# Patient Record
Sex: Female | Born: 1945 | Race: White | Hispanic: No | Marital: Married | Smoking: Never smoker
Health system: Southern US, Community
[De-identification: ages and names within clinical notes are randomized; demographics above are authoritative.]

## PROBLEM LIST (undated history)

## (undated) DIAGNOSIS — I10 Essential (primary) hypertension: Secondary | ICD-10-CM

## (undated) DIAGNOSIS — G501 Atypical facial pain: Principal | ICD-10-CM

## (undated) HISTORY — PX: TONSILLECTOMY: SUR1361

## (undated) HISTORY — DX: Essential (primary) hypertension: I10

## (undated) HISTORY — DX: Atypical facial pain: G50.1

## (undated) HISTORY — PX: SHOULDER SURGERY: SHX246

---

## 1998-02-04 ENCOUNTER — Other Ambulatory Visit: Admission: RE | Admit: 1998-02-04 | Discharge: 1998-02-04 | Payer: Self-pay | Admitting: Obstetrics and Gynecology

## 1998-04-06 ENCOUNTER — Other Ambulatory Visit: Admission: RE | Admit: 1998-04-06 | Discharge: 1998-04-06 | Payer: Self-pay | Admitting: Obstetrics and Gynecology

## 1999-02-08 ENCOUNTER — Other Ambulatory Visit: Admission: RE | Admit: 1999-02-08 | Discharge: 1999-02-08 | Payer: Self-pay | Admitting: Obstetrics and Gynecology

## 1999-02-28 ENCOUNTER — Other Ambulatory Visit: Admission: RE | Admit: 1999-02-28 | Discharge: 1999-02-28 | Payer: Self-pay | Admitting: Obstetrics and Gynecology

## 2000-01-17 ENCOUNTER — Encounter: Admission: RE | Admit: 2000-01-17 | Discharge: 2000-01-17 | Payer: Self-pay | Admitting: Obstetrics and Gynecology

## 2000-01-17 ENCOUNTER — Encounter: Payer: Self-pay | Admitting: Obstetrics and Gynecology

## 2000-02-09 ENCOUNTER — Other Ambulatory Visit: Admission: RE | Admit: 2000-02-09 | Discharge: 2000-02-09 | Payer: Self-pay | Admitting: Obstetrics and Gynecology

## 2001-02-19 ENCOUNTER — Other Ambulatory Visit: Admission: RE | Admit: 2001-02-19 | Discharge: 2001-02-19 | Payer: Self-pay | Admitting: Obstetrics and Gynecology

## 2001-05-23 ENCOUNTER — Encounter: Admission: RE | Admit: 2001-05-23 | Discharge: 2001-05-23 | Payer: Self-pay | Admitting: Obstetrics and Gynecology

## 2001-05-23 ENCOUNTER — Encounter: Payer: Self-pay | Admitting: Obstetrics and Gynecology

## 2002-03-06 ENCOUNTER — Other Ambulatory Visit: Admission: RE | Admit: 2002-03-06 | Discharge: 2002-03-06 | Payer: Self-pay | Admitting: Obstetrics and Gynecology

## 2002-11-12 ENCOUNTER — Encounter: Payer: Self-pay | Admitting: Obstetrics and Gynecology

## 2002-11-12 ENCOUNTER — Encounter: Admission: RE | Admit: 2002-11-12 | Discharge: 2002-11-12 | Payer: Self-pay | Admitting: Obstetrics and Gynecology

## 2004-04-07 ENCOUNTER — Ambulatory Visit (HOSPITAL_COMMUNITY): Admission: RE | Admit: 2004-04-07 | Discharge: 2004-04-07 | Payer: Self-pay | Admitting: Obstetrics and Gynecology

## 2006-05-18 ENCOUNTER — Encounter: Admission: RE | Admit: 2006-05-18 | Discharge: 2006-05-18 | Payer: Self-pay | Admitting: Family Medicine

## 2006-06-27 ENCOUNTER — Ambulatory Visit (HOSPITAL_BASED_OUTPATIENT_CLINIC_OR_DEPARTMENT_OTHER): Admission: RE | Admit: 2006-06-27 | Discharge: 2006-06-28 | Payer: Self-pay | Admitting: Orthopedic Surgery

## 2006-10-15 ENCOUNTER — Ambulatory Visit: Payer: Self-pay | Admitting: Gastroenterology

## 2006-11-01 ENCOUNTER — Ambulatory Visit: Payer: Self-pay | Admitting: Gastroenterology

## 2007-05-21 ENCOUNTER — Encounter: Admission: RE | Admit: 2007-05-21 | Discharge: 2007-05-21 | Payer: Self-pay | Admitting: Family Medicine

## 2009-01-19 ENCOUNTER — Encounter: Admission: RE | Admit: 2009-01-19 | Discharge: 2009-01-19 | Payer: Self-pay | Admitting: Obstetrics and Gynecology

## 2010-06-07 ENCOUNTER — Encounter: Admission: RE | Admit: 2010-06-07 | Discharge: 2010-06-07 | Payer: Self-pay | Admitting: Family Medicine

## 2010-06-14 ENCOUNTER — Encounter: Admission: RE | Admit: 2010-06-14 | Discharge: 2010-06-14 | Payer: Self-pay | Admitting: Family Medicine

## 2011-03-24 NOTE — Op Note (Signed)
NAME:  Dawn Rowe, Dawn Rowe             ACCOUNT NO.:  [id number]   MEDICAL RECORD NO.:  [id number]          PATIENT TYPE:  AMB   LOCATION:  DSC                          FACILITY:  [facility]   PHYSICIAN:  Harvie Junior, M.D.   DATE OF BIRTH:  16-Sep-1946   DATE OF PROCEDURE:  06/27/2006  DATE OF DISCHARGE:  06/28/2006                                 OPERATIVE REPORT   PREOPERATIVE DIAGNOSES:  Rotator cuff tear, MRI documented, with  impingement, acromioclavicular joint arthritis.   POSTOPERATIVE DIAGNOSES:  1. Rotator cuff tear, MRI documented, with impingement.  2. Acromioclavicular joint arthritis.  3. Superior labral tear, anterior to posterior, with undersurface rotator      cuff tear.   PRINCIPLE PROCEDURES:  1. Mini-open rotator cuff repair of chronically torn rotator cuff, which      was severely retracted, and repaired through bone tunnels, with      corresponding acromioplasty.  2. Distal clavicle resection, arthroscopic.  3. Debridement of superior labrum and undersurface rotator cuff tear.   SURGEON:  Harvie Junior, M.D.   ASSISTANT:  Marshia Ly, P.A. (assisted throughout the case)   NOTE:  The date of surgery is going to be June 27, 2006.  Date of repeat  of lost dictation is going to be July 13, 2006.   ANESTHESIA:  General.   ESTIMATED BLOOD LOSS:  None.   BRIEF HISTORY:  Miss Hobbs is a female patient, who has a long history of  having had a right shoulder procedure performed.  She had done well with  that and began having left shoulder pain.  MRI was obtained and showed a  chronically torn and retracted rotator cuff.  We talked about treatment  options, including observation vital signs fixation, and the patient  ultimately wanted to have this fixed.  She had done great on the other side,  so we brought her to the operating room for attempted fixation; but we were  certainly concerned about the retraction preoperatively.   DESCRIPTION OF PROCEDURE:   The patient was brought to the operating room,  and an adequate level of anesthesia was obtained with a general anesthetic,  the patient was placed supine on the operating table, and was then moved to  the beach-chair position and all bony prominences were well padded.  Attention was then turned to the left shoulder.  After this, routine  arthroscopic examination of the shoulder revealed that there was no  significant glenohumeral abnormality.  There was a chronically torn and  retracted rotator cuff that was retracted just shy of the glenoid margin.  We mobilized the cuff anteriorly and posteriorly and could mobilize it to  where it would move almost to the humeral margin, although it was under some  tension.  The biceps tendon was somewhat flattened, but was intact.  The  superior labrum was torn, and this was debrided within the glenohumeral  joint.  The rotator cuff edges were debrided within the glenohumeral joint,  and attention at that time was turned into the subacromial space, where an  anterolateral acromioplasty was performed from the lateral and  posterior  compartment.  The distal clavicle was resected through an anterior  compartment over a span of probably 17 mm.  The rotator cuff was then  identified from the lateral portal.  It was grasped and tugged.  It was  freed up, and we felt that we were going to have to medialize the insertion  point, but given that she had done so well on the opposite side, we felt  that attempt at repair was appropriate, and at this point, a small lateral  incision was made and the arthroscopic portion of the case was abandoned.  Once this was accomplished, the deltoid was split and the rotator cuff was  identified.  Traction stitches were placed, and we began to mobilize the  cuff anteriorly to posteriorly.  Once we mobilized it, we were able to get  it essentially to the humeral margin.  We made a bony trough in the humeral  head.  Once this was  accomplished, the stitches were passed with locking  stitches in the rotator cuff free edge, and the bone tunnels were placed.  Once the stitches had been placed through the bone tunnels, the anterior  stitch went well.  The second and third stitches, unfortunately, coalesced  in the bone tunnel and retracted back.  At that point, it was felt that  additional fixation would be necessary.  The posterior portion had been  pulled down into the bone trough as well, and there were concerns about  coalescence in this area as well.  Once this had been completed, a 5.5  suture anchor was placed into cortical bone down distally, and the stays off  of this were tied to each of the 2 stitches, which had coalesced in the bone  trough, and actually gave excellent fixation, as well as keeping the tendon  stuck down into the bone trough.  The bone trough had been created  essentially on the humeral head articular surface to make sure that we had  medialized the insertion point.  Otherwise, the rotator cuff was not going  to be able to be repaired under no tension.  This was repaired with the arm  at the side.  At this point, the wound was copiously irrigated.  The distal  clavicle resection and acromioplasty have previously been performed, and  these were evaluated open and felt to be adequate.  The deltoid was then  closed with 1 Vicryl running suture, the subcu with 0 and 2-0 Vicryl, and  the skin with a 3-0 Maxon pullout suture.  Benzoin and Steri-Strips were  applied.  The patient was placed into an UltraSling.  This did take some  tension off the rotator cuff repair, but clearly, the arm could come to the  side without excessive tension on the rotator cuff.  At this point, the  patient was taken to the recovery room, where she was noted to be in  satisfactory condition.      Harvie Junior, M.D.  Electronically Signed    JLG/MEDQ  D:  07/13/2006  T:  07/13/2006  Job:  [id number]

## 2011-08-07 ENCOUNTER — Other Ambulatory Visit: Payer: Self-pay | Admitting: Family Medicine

## 2011-08-07 DIAGNOSIS — Z1231 Encounter for screening mammogram for malignant neoplasm of breast: Secondary | ICD-10-CM

## 2011-09-01 ENCOUNTER — Ambulatory Visit
Admission: RE | Admit: 2011-09-01 | Discharge: 2011-09-01 | Disposition: A | Discharge status: Home / Self Care | Payer: BLUE CROSS/BLUE SHIELD | Source: Ambulatory Visit | Attending: Family Medicine | Admitting: Family Medicine

## 2011-09-01 DIAGNOSIS — Z1231 Encounter for screening mammogram for malignant neoplasm of breast: Secondary | ICD-10-CM

## 2011-10-20 ENCOUNTER — Other Ambulatory Visit (HOSPITAL_COMMUNITY): Payer: Self-pay | Admitting: Obstetrics and Gynecology

## 2011-10-20 DIAGNOSIS — N950 Postmenopausal bleeding: Secondary | ICD-10-CM

## 2011-10-25 ENCOUNTER — Ambulatory Visit (HOSPITAL_COMMUNITY)
Admission: RE | Admit: 2011-10-25 | Discharge: 2011-10-25 | Disposition: A | Discharge status: Home / Self Care | Payer: Medicare Other | Source: Ambulatory Visit | Attending: Obstetrics and Gynecology | Admitting: Obstetrics and Gynecology

## 2011-10-25 ENCOUNTER — Other Ambulatory Visit: Payer: Self-pay | Admitting: Obstetrics and Gynecology

## 2011-10-25 DIAGNOSIS — D251 Intramural leiomyoma of uterus: Secondary | ICD-10-CM | POA: Insufficient documentation

## 2011-10-25 DIAGNOSIS — N950 Postmenopausal bleeding: Secondary | ICD-10-CM | POA: Insufficient documentation

## 2011-10-25 DIAGNOSIS — D250 Submucous leiomyoma of uterus: Secondary | ICD-10-CM | POA: Insufficient documentation

## 2012-09-17 ENCOUNTER — Other Ambulatory Visit: Payer: Self-pay | Admitting: Family Medicine

## 2012-09-17 DIAGNOSIS — Z1231 Encounter for screening mammogram for malignant neoplasm of breast: Secondary | ICD-10-CM

## 2012-09-17 DIAGNOSIS — Z780 Asymptomatic menopausal state: Secondary | ICD-10-CM

## 2012-10-28 ENCOUNTER — Ambulatory Visit: Payer: BLUE CROSS/BLUE SHIELD

## 2012-10-28 ENCOUNTER — Other Ambulatory Visit: Payer: BLUE CROSS/BLUE SHIELD

## 2012-11-29 ENCOUNTER — Ambulatory Visit
Admission: RE | Admit: 2012-11-29 | Discharge: 2012-11-29 | Disposition: A | Discharge status: Home / Self Care | Payer: Medicare Other | Source: Ambulatory Visit | Attending: Family Medicine | Admitting: Family Medicine

## 2012-11-29 DIAGNOSIS — Z1231 Encounter for screening mammogram for malignant neoplasm of breast: Secondary | ICD-10-CM

## 2012-11-29 DIAGNOSIS — Z780 Asymptomatic menopausal state: Secondary | ICD-10-CM

## 2013-11-04 ENCOUNTER — Other Ambulatory Visit: Payer: Self-pay

## 2013-11-04 DIAGNOSIS — Z1231 Encounter for screening mammogram for malignant neoplasm of breast: Secondary | ICD-10-CM

## 2013-12-04 ENCOUNTER — Ambulatory Visit
Admission: RE | Admit: 2013-12-04 | Discharge: 2013-12-04 | Disposition: A | Discharge status: Home / Self Care | Payer: Self-pay | Source: Ambulatory Visit

## 2013-12-04 DIAGNOSIS — Z1231 Encounter for screening mammogram for malignant neoplasm of breast: Secondary | ICD-10-CM

## 2014-10-27 ENCOUNTER — Encounter: Payer: Self-pay | Admitting: Gastroenterology

## 2015-01-21 ENCOUNTER — Other Ambulatory Visit: Payer: Self-pay

## 2015-01-21 DIAGNOSIS — Z1231 Encounter for screening mammogram for malignant neoplasm of breast: Secondary | ICD-10-CM

## 2015-01-25 ENCOUNTER — Other Ambulatory Visit: Payer: Self-pay | Admitting: Family Medicine

## 2015-01-25 DIAGNOSIS — M8580 Other specified disorders of bone density and structure, unspecified site: Secondary | ICD-10-CM

## 2015-02-04 ENCOUNTER — Ambulatory Visit
Admission: RE | Admit: 2015-02-04 | Discharge: 2015-02-04 | Disposition: A | Discharge status: Home / Self Care | Payer: Medicare HMO | Source: Ambulatory Visit | Attending: Family Medicine | Admitting: Family Medicine

## 2015-02-04 ENCOUNTER — Ambulatory Visit
Admission: RE | Admit: 2015-02-04 | Discharge: 2015-02-04 | Disposition: A | Discharge status: Home / Self Care | Payer: Medicare HMO | Source: Ambulatory Visit

## 2015-02-04 DIAGNOSIS — M8580 Other specified disorders of bone density and structure, unspecified site: Secondary | ICD-10-CM

## 2015-02-04 DIAGNOSIS — Z1231 Encounter for screening mammogram for malignant neoplasm of breast: Secondary | ICD-10-CM

## 2015-08-16 DIAGNOSIS — D225 Melanocytic nevi of trunk: Secondary | ICD-10-CM | POA: Diagnosis not present

## 2015-08-16 DIAGNOSIS — L570 Actinic keratosis: Secondary | ICD-10-CM | POA: Diagnosis not present

## 2015-08-16 DIAGNOSIS — D2272 Melanocytic nevi of left lower limb, including hip: Secondary | ICD-10-CM | POA: Diagnosis not present

## 2015-08-16 DIAGNOSIS — L821 Other seborrheic keratosis: Secondary | ICD-10-CM | POA: Diagnosis not present

## 2015-08-16 DIAGNOSIS — I788 Other diseases of capillaries: Secondary | ICD-10-CM | POA: Diagnosis not present

## 2015-08-23 DIAGNOSIS — M5442 Lumbago with sciatica, left side: Secondary | ICD-10-CM | POA: Diagnosis not present

## 2015-08-23 DIAGNOSIS — M545 Low back pain: Secondary | ICD-10-CM | POA: Diagnosis not present

## 2015-08-30 DIAGNOSIS — M545 Low back pain: Secondary | ICD-10-CM | POA: Diagnosis not present

## 2015-09-02 DIAGNOSIS — M5442 Lumbago with sciatica, left side: Secondary | ICD-10-CM | POA: Diagnosis not present

## 2015-09-03 DIAGNOSIS — M5416 Radiculopathy, lumbar region: Secondary | ICD-10-CM | POA: Diagnosis not present

## 2015-09-11 DIAGNOSIS — Z23 Encounter for immunization: Secondary | ICD-10-CM | POA: Diagnosis not present

## 2015-09-15 DIAGNOSIS — M5416 Radiculopathy, lumbar region: Secondary | ICD-10-CM | POA: Diagnosis not present

## 2015-09-27 DIAGNOSIS — M5416 Radiculopathy, lumbar region: Secondary | ICD-10-CM | POA: Diagnosis not present

## 2015-12-21 DIAGNOSIS — H5213 Myopia, bilateral: Secondary | ICD-10-CM | POA: Diagnosis not present

## 2015-12-21 DIAGNOSIS — H25043 Posterior subcapsular polar age-related cataract, bilateral: Secondary | ICD-10-CM | POA: Diagnosis not present

## 2015-12-29 DIAGNOSIS — E559 Vitamin D deficiency, unspecified: Secondary | ICD-10-CM | POA: Diagnosis not present

## 2015-12-29 DIAGNOSIS — L989 Disorder of the skin and subcutaneous tissue, unspecified: Secondary | ICD-10-CM | POA: Diagnosis not present

## 2015-12-29 DIAGNOSIS — E785 Hyperlipidemia, unspecified: Secondary | ICD-10-CM | POA: Diagnosis not present

## 2015-12-29 DIAGNOSIS — I10 Essential (primary) hypertension: Secondary | ICD-10-CM | POA: Diagnosis not present

## 2015-12-29 DIAGNOSIS — Z79899 Other long term (current) drug therapy: Secondary | ICD-10-CM | POA: Diagnosis not present

## 2015-12-29 DIAGNOSIS — Z1389 Encounter for screening for other disorder: Secondary | ICD-10-CM | POA: Diagnosis not present

## 2016-01-06 DIAGNOSIS — R69 Illness, unspecified: Secondary | ICD-10-CM | POA: Diagnosis not present

## 2016-01-19 DIAGNOSIS — Z01419 Encounter for gynecological examination (general) (routine) without abnormal findings: Secondary | ICD-10-CM | POA: Diagnosis not present

## 2016-01-19 DIAGNOSIS — Z6825 Body mass index (BMI) 25.0-25.9, adult: Secondary | ICD-10-CM | POA: Diagnosis not present

## 2016-03-06 ENCOUNTER — Other Ambulatory Visit: Payer: Self-pay

## 2016-03-06 DIAGNOSIS — Z1231 Encounter for screening mammogram for malignant neoplasm of breast: Secondary | ICD-10-CM

## 2016-03-09 ENCOUNTER — Other Ambulatory Visit: Payer: Self-pay

## 2016-03-09 ENCOUNTER — Ambulatory Visit
Admission: RE | Admit: 2016-03-09 | Discharge: 2016-03-09 | Disposition: A | Discharge status: Home / Self Care | Payer: Medicare HMO | Source: Ambulatory Visit

## 2016-03-09 DIAGNOSIS — Z1231 Encounter for screening mammogram for malignant neoplasm of breast: Secondary | ICD-10-CM

## 2016-03-18 DIAGNOSIS — J0100 Acute maxillary sinusitis, unspecified: Secondary | ICD-10-CM | POA: Diagnosis not present

## 2016-07-11 DIAGNOSIS — R69 Illness, unspecified: Secondary | ICD-10-CM | POA: Diagnosis not present

## 2016-08-17 ENCOUNTER — Encounter: Payer: Self-pay | Admitting: Gastroenterology

## 2016-09-03 DIAGNOSIS — Z23 Encounter for immunization: Secondary | ICD-10-CM | POA: Diagnosis not present

## 2016-10-16 DIAGNOSIS — L918 Other hypertrophic disorders of the skin: Secondary | ICD-10-CM | POA: Diagnosis not present

## 2016-10-16 DIAGNOSIS — D2271 Melanocytic nevi of right lower limb, including hip: Secondary | ICD-10-CM | POA: Diagnosis not present

## 2016-10-16 DIAGNOSIS — D225 Melanocytic nevi of trunk: Secondary | ICD-10-CM | POA: Diagnosis not present

## 2016-10-16 DIAGNOSIS — L814 Other melanin hyperpigmentation: Secondary | ICD-10-CM | POA: Diagnosis not present

## 2016-10-16 DIAGNOSIS — D2262 Melanocytic nevi of left upper limb, including shoulder: Secondary | ICD-10-CM | POA: Diagnosis not present

## 2016-10-16 DIAGNOSIS — L821 Other seborrheic keratosis: Secondary | ICD-10-CM | POA: Diagnosis not present

## 2016-10-16 DIAGNOSIS — D1801 Hemangioma of skin and subcutaneous tissue: Secondary | ICD-10-CM | POA: Diagnosis not present

## 2016-10-16 DIAGNOSIS — I788 Other diseases of capillaries: Secondary | ICD-10-CM | POA: Diagnosis not present

## 2016-12-22 DIAGNOSIS — H5213 Myopia, bilateral: Secondary | ICD-10-CM | POA: Diagnosis not present

## 2016-12-22 DIAGNOSIS — H25043 Posterior subcapsular polar age-related cataract, bilateral: Secondary | ICD-10-CM | POA: Diagnosis not present

## 2017-01-02 DIAGNOSIS — I10 Essential (primary) hypertension: Secondary | ICD-10-CM | POA: Diagnosis not present

## 2017-01-02 DIAGNOSIS — E785 Hyperlipidemia, unspecified: Secondary | ICD-10-CM | POA: Diagnosis not present

## 2017-01-02 DIAGNOSIS — Q678 Other congenital deformities of chest: Secondary | ICD-10-CM | POA: Diagnosis not present

## 2017-01-02 DIAGNOSIS — M8580 Other specified disorders of bone density and structure, unspecified site: Secondary | ICD-10-CM | POA: Diagnosis not present

## 2017-01-02 DIAGNOSIS — Z0000 Encounter for general adult medical examination without abnormal findings: Secondary | ICD-10-CM | POA: Diagnosis not present

## 2017-01-02 DIAGNOSIS — E559 Vitamin D deficiency, unspecified: Secondary | ICD-10-CM | POA: Diagnosis not present

## 2017-01-10 DIAGNOSIS — R69 Illness, unspecified: Secondary | ICD-10-CM | POA: Diagnosis not present

## 2017-01-11 ENCOUNTER — Other Ambulatory Visit: Payer: Self-pay | Admitting: Family Medicine

## 2017-01-11 DIAGNOSIS — M8580 Other specified disorders of bone density and structure, unspecified site: Secondary | ICD-10-CM

## 2017-01-12 ENCOUNTER — Other Ambulatory Visit: Payer: Self-pay | Admitting: Family Medicine

## 2017-01-12 DIAGNOSIS — Z1231 Encounter for screening mammogram for malignant neoplasm of breast: Secondary | ICD-10-CM

## 2017-01-31 ENCOUNTER — Other Ambulatory Visit: Payer: Medicare HMO

## 2017-01-31 ENCOUNTER — Ambulatory Visit: Payer: Medicare HMO

## 2017-04-30 ENCOUNTER — Ambulatory Visit
Admission: RE | Admit: 2017-04-30 | Discharge: 2017-04-30 | Disposition: A | Discharge status: Home / Self Care | Payer: Medicare HMO | Source: Ambulatory Visit | Attending: Family Medicine | Admitting: Family Medicine

## 2017-04-30 DIAGNOSIS — Z1231 Encounter for screening mammogram for malignant neoplasm of breast: Secondary | ICD-10-CM

## 2017-04-30 DIAGNOSIS — M85861 Other specified disorders of bone density and structure, right lower leg: Secondary | ICD-10-CM | POA: Diagnosis not present

## 2017-04-30 DIAGNOSIS — Z780 Asymptomatic menopausal state: Secondary | ICD-10-CM | POA: Diagnosis not present

## 2017-04-30 DIAGNOSIS — M8580 Other specified disorders of bone density and structure, unspecified site: Secondary | ICD-10-CM

## 2017-05-04 DIAGNOSIS — N951 Menopausal and female climacteric states: Secondary | ICD-10-CM | POA: Diagnosis not present

## 2017-05-04 DIAGNOSIS — Z01419 Encounter for gynecological examination (general) (routine) without abnormal findings: Secondary | ICD-10-CM | POA: Diagnosis not present

## 2017-05-04 DIAGNOSIS — Z124 Encounter for screening for malignant neoplasm of cervix: Secondary | ICD-10-CM | POA: Diagnosis not present

## 2017-05-29 DIAGNOSIS — M7130 Other bursal cyst, unspecified site: Secondary | ICD-10-CM | POA: Diagnosis not present

## 2017-07-17 DIAGNOSIS — R69 Illness, unspecified: Secondary | ICD-10-CM | POA: Diagnosis not present

## 2017-08-13 DIAGNOSIS — Z23 Encounter for immunization: Secondary | ICD-10-CM | POA: Diagnosis not present

## 2017-12-24 DIAGNOSIS — H5213 Myopia, bilateral: Secondary | ICD-10-CM | POA: Diagnosis not present

## 2017-12-24 DIAGNOSIS — H25813 Combined forms of age-related cataract, bilateral: Secondary | ICD-10-CM | POA: Diagnosis not present

## 2018-01-07 DIAGNOSIS — Z1211 Encounter for screening for malignant neoplasm of colon: Secondary | ICD-10-CM | POA: Diagnosis not present

## 2018-01-07 DIAGNOSIS — G720 Drug-induced myopathy: Secondary | ICD-10-CM | POA: Diagnosis not present

## 2018-01-07 DIAGNOSIS — Z0000 Encounter for general adult medical examination without abnormal findings: Secondary | ICD-10-CM | POA: Diagnosis not present

## 2018-01-07 DIAGNOSIS — Z1389 Encounter for screening for other disorder: Secondary | ICD-10-CM | POA: Diagnosis not present

## 2018-01-07 DIAGNOSIS — E559 Vitamin D deficiency, unspecified: Secondary | ICD-10-CM | POA: Diagnosis not present

## 2018-01-07 DIAGNOSIS — E785 Hyperlipidemia, unspecified: Secondary | ICD-10-CM | POA: Diagnosis not present

## 2018-01-07 DIAGNOSIS — M8580 Other specified disorders of bone density and structure, unspecified site: Secondary | ICD-10-CM | POA: Diagnosis not present

## 2018-01-07 DIAGNOSIS — I10 Essential (primary) hypertension: Secondary | ICD-10-CM | POA: Diagnosis not present

## 2018-01-15 DIAGNOSIS — R69 Illness, unspecified: Secondary | ICD-10-CM | POA: Diagnosis not present

## 2018-01-16 DIAGNOSIS — W19XXXA Unspecified fall, initial encounter: Secondary | ICD-10-CM | POA: Diagnosis not present

## 2018-01-16 DIAGNOSIS — S0083XA Contusion of other part of head, initial encounter: Secondary | ICD-10-CM | POA: Diagnosis not present

## 2018-01-16 DIAGNOSIS — S20211A Contusion of right front wall of thorax, initial encounter: Secondary | ICD-10-CM | POA: Diagnosis not present

## 2018-02-06 ENCOUNTER — Encounter: Payer: Self-pay | Admitting: Family Medicine

## 2018-05-01 ENCOUNTER — Encounter: Payer: Self-pay | Admitting: Gastroenterology

## 2018-05-01 ENCOUNTER — Other Ambulatory Visit: Payer: Self-pay | Admitting: Family Medicine

## 2018-05-01 DIAGNOSIS — Z1231 Encounter for screening mammogram for malignant neoplasm of breast: Secondary | ICD-10-CM

## 2018-05-24 ENCOUNTER — Ambulatory Visit
Admission: RE | Admit: 2018-05-24 | Discharge: 2018-05-24 | Disposition: A | Discharge status: Home / Self Care | Payer: Medicare HMO | Source: Ambulatory Visit | Attending: Family Medicine | Admitting: Family Medicine

## 2018-05-24 DIAGNOSIS — Z1231 Encounter for screening mammogram for malignant neoplasm of breast: Secondary | ICD-10-CM

## 2018-07-03 ENCOUNTER — Ambulatory Visit (AMBULATORY_SURGERY_CENTER): Payer: Self-pay | Admitting: *Deleted

## 2018-07-03 VITALS — Ht 63.0 in | Wt 145.0 lb

## 2018-07-03 DIAGNOSIS — Z1211 Encounter for screening for malignant neoplasm of colon: Secondary | ICD-10-CM

## 2018-07-03 MED ORDER — PEG 3350-KCL-NA BICARB-NACL 420 G PO SOLR: 4000.0000 mL | Freq: Once | ORAL | 0 refills | Status: AC

## 2018-07-03 NOTE — Progress Notes (Signed)
Patient denies any allergies to eggs or soy. Patient denies any problems with anesthesia/sedation. Patient denies any oxygen use at home. Patient denies taking any diet/weight loss medications or blood thinners. EMMI education offered, pt declined.  

## 2018-07-04 ENCOUNTER — Encounter: Payer: Self-pay | Admitting: Gastroenterology

## 2018-07-17 ENCOUNTER — Ambulatory Visit (AMBULATORY_SURGERY_CENTER): Payer: Medicare HMO | Admitting: Gastroenterology

## 2018-07-17 ENCOUNTER — Encounter: Payer: Self-pay | Admitting: Gastroenterology

## 2018-07-17 VITALS — BP 120/69 | HR 60 | Temp 97.5°F | Resp 14 | Ht 63.0 in | Wt 145.0 lb

## 2018-07-17 DIAGNOSIS — Z1211 Encounter for screening for malignant neoplasm of colon: Secondary | ICD-10-CM | POA: Diagnosis not present

## 2018-07-17 MED ORDER — SODIUM CHLORIDE 0.9 % IV SOLN: 500.0000 mL | Freq: Once | INTRAVENOUS | Status: DC

## 2018-07-17 NOTE — Progress Notes (Signed)
Pt's states no medical or surgical changes since previsit or office visit. 

## 2018-07-17 NOTE — Patient Instructions (Signed)

## 2018-07-17 NOTE — Op Note (Signed)
[facility] Patient Name: Dawn Rowe Procedure Date: 07/17/2018 11:03 AM MRN: [id number] Endoscopist: Milus Banister , MD Age: 72 Referring MD:  Date of Birth: 1946-08-14 Gender: Female Account #: [id number] Procedure:                Colonoscopy Indications:              Screening for colorectal malignant neoplasm Medicines:                Monitored Anesthesia Care Procedure:                Pre-Anesthesia Assessment:                           - Prior to the procedure, a History and Physical                            was performed, and patient medications and                            allergies were reviewed. The patient's tolerance of                            previous anesthesia was also reviewed. The risks                            and benefits of the procedure and the sedation                            options and risks were discussed with the patient.                            All questions were answered, and informed consent                            was obtained. Prior Anticoagulants: The patient has                            taken no previous anticoagulant or antiplatelet                            agents. ASA Grade Assessment: II - A patient with                            mild systemic disease. After reviewing the risks                            and benefits, the patient was deemed in                            satisfactory condition to undergo the procedure.                           After obtaining informed consent, the colonoscope  was passed under direct vision. Throughout the                            procedure, the patient's blood pressure, pulse, and                            oxygen saturations were monitored continuously. The                            Colonoscope was introduced through the anus and                            advanced to the the cecum, identified by                            appendiceal orifice  and ileocecal valve. The                            colonoscopy was performed without difficulty. The                            patient tolerated the procedure well. The quality                            of the bowel preparation was good. The ileocecal                            valve, appendiceal orifice, and rectum were                            photographed. Scope In: 11:04:44 AM Scope Out: 11:21:45 AM Scope Withdrawal Time: 0 hours 12 minutes 13 seconds  Total Procedure Duration: 0 hours 17 minutes 1 second  Findings:                 The entire examined colon appeared normal on direct                            and retroflexion views. Complications:            No immediate complications. Estimated blood loss:                            None. Estimated Blood Loss:     Estimated blood loss: none. Impression:               - The entire examined colon is normal on direct and                            retroflexion views.                           - No polyps or cancers. Recommendation:           - Patient has a contact number available for  emergencies. The signs and symptoms of potential                            delayed complications were discussed with the                            patient. Return to normal activities tomorrow.                            Written discharge instructions were provided to the                            patient.                           - Resume previous diet.                           - Continue present medications.                           You do not need any further colon cancer screening                            tests (including stool testing). These types of                            tests generally stop around age 53-80. Milus Banister, MD 07/17/2018 11:24:03 AM This report has been signed electronically.

## 2018-07-17 NOTE — Progress Notes (Signed)
To PACU, VSS. Report to [email or web address] 

## 2018-07-17 NOTE — Progress Notes (Signed)
Called to room to assist during endoscopic procedure.  Patient ID and intended procedure confirmed with present staff. Received instructions for my participation in the procedure from the performing physician.  

## 2018-07-18 ENCOUNTER — Telehealth: Payer: Self-pay

## 2018-07-18 NOTE — Telephone Encounter (Signed)
  Follow up Call-  Call back number 07/17/2018  Post procedure Call Back phone  # [phone]  Permission to leave phone message Yes  Some recent data might be hidden     Patient questions:  Do you have a fever, pain , or abdominal swelling? No. Pain Score  0 *  Have you tolerated food without any problems? Yes.    Have you been able to return to your normal activities? Yes.    Do you have any questions about your discharge instructions: Diet   No. Medications  No. Follow up visit  No.  Do you have questions or concerns about your Care? No.  Actions: * If pain score is 4 or above: No action needed, pain <4.

## 2018-07-24 DIAGNOSIS — R69 Illness, unspecified: Secondary | ICD-10-CM | POA: Diagnosis not present

## 2018-08-07 DIAGNOSIS — R69 Illness, unspecified: Secondary | ICD-10-CM | POA: Diagnosis not present

## 2018-11-18 DIAGNOSIS — R51 Headache: Secondary | ICD-10-CM | POA: Diagnosis not present

## 2018-11-20 ENCOUNTER — Emergency Department (HOSPITAL_COMMUNITY)
Admission: EM | Admit: 2018-11-20 | Discharge: 2018-11-20 | Disposition: A | Discharge status: Home / Self Care | Payer: Medicare HMO | Attending: Emergency Medicine | Admitting: Emergency Medicine

## 2018-11-20 ENCOUNTER — Other Ambulatory Visit: Payer: Self-pay

## 2018-11-20 ENCOUNTER — Encounter (HOSPITAL_COMMUNITY): Payer: Self-pay | Admitting: *Deleted

## 2018-11-20 DIAGNOSIS — Z79899 Other long term (current) drug therapy: Secondary | ICD-10-CM | POA: Diagnosis not present

## 2018-11-20 DIAGNOSIS — K0889 Other specified disorders of teeth and supporting structures: Secondary | ICD-10-CM | POA: Insufficient documentation

## 2018-11-20 DIAGNOSIS — I440 Atrioventricular block, first degree: Secondary | ICD-10-CM | POA: Diagnosis not present

## 2018-11-20 DIAGNOSIS — I10 Essential (primary) hypertension: Secondary | ICD-10-CM | POA: Diagnosis not present

## 2018-11-20 LAB — I-STAT TROPONIN, ED: Troponin i, poc: 0 ng/mL (ref 0.00–0.08)

## 2018-11-20 MED ORDER — HYDROCODONE-ACETAMINOPHEN 5-325 MG PO TABS
2.0000 | ORAL_TABLET | Freq: Once | ORAL | Status: AC
  Administered 2018-11-20: 2 via ORAL
  Filled 2018-11-20: qty 2

## 2018-11-20 NOTE — Discharge Instructions (Signed)
You were evaluated today for dental pain.  Please follow-up with your endodontist today.

## 2018-11-20 NOTE — ED Provider Notes (Signed)
[facility] Provider Note   CSN: [id number] Arrival date & time: 11/20/18  3244   History   Chief Complaint Chief Complaint  Patient presents with  . Dental Pain    HPI Dawn Rowe is a 73 y.o. female with past medical history significant for hypertension who presents for evaluation of dental pain.  Patient states she has had dental pain located to her upper and lower right teeth.  Patient states pain is been present x3 days.  Patient was seen by PCP Monday morning with negative facial x-rays because they thought patient's pain was coming from her sinuses.  Patient was seen by dentist as well as endodontist yesterday.  Patient had CT scan of her upper and lower jaw.  Per patient and endodontist, Freddrick March, patient CT scan was negative yesterday.  Patients endodontist is on speaker phone during my initial evaluation.  Patient was placed on amoxicillin as well as hydrocodone for her pain.  Patient states she took 1 hydrocodone at midnight.  Patient states she was awoken by pain at 3 AM this morning.  Patient states she took 1 tramadol at that time for pain and proceeded to the emergency department for evaluation.  Patient states she does have a history of root canals as well as dental caries.  Patient states she does not have any facial pain, numbness or tingling to her face.  Denies fever, chills, nausea, vomiting, facial asymmetry, eye pain, rashes or lesions to her face, jaw pain, jaw occlusion, trismus, hx history of dental infections, chest pain, shortness of breath, nasal congestion, rhinorrhea.  Patient rates her current pain a 9/10.  Pain does not radiate.  Denies additional aggravating or alleviating factors.  History provided by patient and patient's husband.  No interpreter was used.  HPI  Past Medical History:  Diagnosis Date  . Hypertension     There are no active problems to display for this patient.   Past Surgical History:    Procedure Laterality Date  . SHOULDER SURGERY Bilateral   . TONSILLECTOMY       OB History   No obstetric history on file.      Home Medications    Prior to Admission medications   Medication Sig Start Date End Date Taking? Authorizing Provider  Biotin 10000 MCG TABS Take by mouth.    [provider]  Cholecalciferol (VITAMIN D3 PO) Take 1 tablet by mouth daily.    [provider]  Coenzyme Q10 (CO Q 10 PO) Take 100 mg by mouth daily.    [provider]  hydrochlorothiazide (HYDRODIURIL) 25 MG tablet Take 25 mg by mouth daily. 06/15/18   [provider]  Multiple Vitamins-Minerals (MULTIVITAMIN ADULT PO) Take 1 tablet by mouth daily.    [provider]  Omega-3 Fatty Acids (FISH OIL) 1200 MG CAPS Take 1 capsule by mouth daily.    [provider]    Family History Family History  Problem Relation Age of Onset  . Colon cancer Neg Hx   . Colon polyps Neg Hx   . Esophageal cancer Neg Hx   . Rectal cancer Neg Hx   . Stomach cancer Neg Hx     Social History Social History   Tobacco Use  . Smoking status: Never Smoker  . Smokeless tobacco: Never Used  Substance Use Topics  . Alcohol use: Yes    Alcohol/week: 4.0 standard drinks    Types: 4 Glasses of wine per week  .  Drug use: Not Currently     Allergies   Decongestant [pseudoephedrine hcl]   Review of Systems Review of Systems  Constitutional: Negative.   HENT: Positive for dental problem. Negative for congestion, drooling, ear discharge, ear pain, facial swelling, hearing loss, mouth sores, nosebleeds, postnasal drip, rhinorrhea, sinus pressure, sinus pain, sneezing, sore throat, tinnitus, trouble swallowing and voice change.   Eyes: Negative.   Respiratory: Negative.   Cardiovascular: Negative.   Gastrointestinal: Negative.   Genitourinary: Negative.   Musculoskeletal: Negative.   Skin: Negative.   Neurological: Negative.   All other systems reviewed and  are negative.    Physical Exam Updated Vital Signs BP (!) 165/89 (BP Location: Right Arm)   Pulse 78   Temp (!) 97.5 F (36.4 C) (Oral)   Resp 18   Ht 5\' 3"  (1.6 m)   Wt 63.5 kg   SpO2 93%   BMI 24.80 kg/m   Physical Exam Vitals signs and nursing note reviewed.  Constitutional:      General: She is awake. She is not in acute distress.    Appearance: She is well-developed. She is not ill-appearing, toxic-appearing or diaphoretic.  HENT:     Head: Normocephalic and atraumatic.     Jaw: There is normal jaw occlusion. No trismus, tenderness, swelling, pain on movement or malocclusion.     Comments: No trismus, tenderness, pain on movement, swelling or malocclusion of the jaw.    Right Ear: Hearing, tympanic membrane, ear canal and external ear normal. No drainage, swelling or tenderness. Tympanic membrane is not scarred, perforated, erythematous, retracted or bulging.     Left Ear: Hearing, tympanic membrane, ear canal and external ear normal. No drainage, swelling or tenderness. Tympanic membrane is not scarred, perforated, erythematous, retracted or bulging.     Nose: Nose normal. No nasal deformity, septal deviation, signs of injury, nasal tenderness, mucosal edema, congestion or rhinorrhea.     Right Turbinates: Not enlarged, swollen or pale.     Left Turbinates: Not enlarged, swollen or pale.     Right Sinus: No maxillary sinus tenderness or frontal sinus tenderness.     Left Sinus: No maxillary sinus tenderness or frontal sinus tenderness.     Comments: No sinus tenderness.  Turbinates are not swollen or enlarged.  No facial tenderness over facial nerve.  No paresthesias over facial nerve, cheek and jaw.    Mouth/Throat:     Lips: Pink.     Mouth: Mucous membranes are moist. No injury, lacerations, oral lesions or angioedema.     Dentition: Does not have dentures. Dental tenderness present. No gingival swelling, dental abscesses or gum lesions.     Tongue: No lesions.      Pharynx: Oropharynx is clear. Uvula midline. No pharyngeal swelling, oropharyngeal exudate, posterior oropharyngeal erythema or uvula swelling.     Tonsils: No tonsillar exudate or tonsillar abscesses. Swelling: 0 on the right. 0 on the left.      Comments: Posterior Oropharynx clear.  Uvula midline.  No tonsillar edema or exudate.  Patient with dental pain over # 2,3,31,30.  No gingival swelling or erythema.  No gingival tenderness.  No evidence of periapical abscess.  Palate without lesions or abnormal elevation. No palate masses.  Patient with dentition with teeth fillings. Eyes:     Pupils: Pupils are equal, round, and reactive to light.     Comments: No horizontal, vertical or rotational nystagmus   Neck:     Musculoskeletal: Full passive range of motion without  pain and normal range of motion.     Trachea: Trachea and phonation normal.     Comments: Full active and passive ROM without pain No midline or paraspinal tenderness No nuchal rigidity or meningeal signs  No submandibular swelling or erythema. Cardiovascular:     Rate and Rhythm: Normal rate.     Pulses: Normal pulses.     Heart sounds: Normal heart sounds.  Pulmonary:     Effort: Pulmonary effort is normal. No respiratory distress.     Breath sounds: Normal breath sounds and air entry.     Comments: Clear to auscultation bilaterally without wheeze, rhonchi or rales. Abdominal:     General: There is no distension.     Comments: Soft, nontender without rebound or guarding.  Musculoskeletal: Normal range of motion.     Comments: Moves all extremities without difficulty.  Lymphadenopathy:     Cervical: No cervical adenopathy.  Skin:    General: Skin is warm and dry.     Comments: No rashes, lesions to face or neck.  No evidence of erythema, edema or ecchymosis.  No facial swelling.  Neurological:     Mental Status: She is alert.     Comments: Mental Status:  Alert, oriented, thought content appropriate. Speech fluent  without evidence of aphasia. Able to follow 2 step commands without difficulty.  Cranial Nerves:  II:  Peripheral visual fields grossly normal, pupils equal, round, reactive to light III,IV, VI: ptosis not present, extra-ocular motions intact bilaterally  V,VII: smile symmetric, facial light touch sensation equal VIII: hearing grossly normal bilaterally  IX,X: midline uvula rise  XI: bilateral shoulder shrug equal and strong XII: midline tongue extension  Motor:  5/5 in upper and lower extremities bilaterally including strong and equal grip strength and dorsiflexion/plantar flexion Sensory: Pinprick and light touch normal in all extremities.  Deep Tendon Reflexes: 2+ and symmetric  Cerebellar: normal finger-to-nose with bilateral upper extremities Gait: normal gait and balance CV: distal pulses palpable throughout    Psychiatric:        Behavior: Behavior is cooperative.      ED Treatments / Results  Labs (all labs ordered are listed, but only abnormal results are displayed) Labs Reviewed  I-STAT TROPONIN, ED    EKG EKG Interpretation  Date/Time:  Wednesday November 20 2018 07:40:24 EST Ventricular Rate:  65 PR Interval:  226 QRS Duration: 96 QT Interval:  410 QTC Calculation: 426 R Axis:   -10 Text Interpretation:  Sinus rhythm with sinus arrhythmia with 1st degree A-V block Septal infarct , age undetermined Abnormal ECG normal,no change from previous Confirmed by Charlesetta Shanks [phone]) on 11/20/2018 8:23:10 AM   Radiology No results found.  Procedures Procedures (including critical care time)  Medications Ordered in ED Medications  HYDROcodone-acetaminophen (NORCO/VICODIN) 5-325 MG per tablet 2 tablet (2 tablets Oral Given 11/20/18 0754)     Initial Impression / Assessment and Plan / ED Course  I have reviewed the triage vital signs and the nursing notes.  Pertinent labs & imaging results that were available during my care of the patient were reviewed by me  and considered in my medical decision making (see chart for details).  73 year old female who appears otherwise well presents for evaluation of dental pain.  Pain onset 3 days ago.  Patient has been seen by PCP, dentist as well as endodontist.  Patient had CT scan of upper and lower jaw yesterday.  Patient's endodontist, Freddrick March was on speaker phone during my evaluation.  Per endodontist and family patient had negative CT scan done yesterday.  Patient was placed on antibiotics and pain medicine prophylactically.  She states she had pain worsening yesterday evening.  Took 1 hydrocodone 5 at midnight.  Patient states she was awoken at 3 AM with pain.  Took 1 tramadol at that time and proceeded to the emergency department.  Patient denies facial paresthesias or facial tenderness.  There is no facial swelling on exam.  Patient has no tenderness to jaw or gingiva.  No trismus.  Patient states her pain is located to tooth #2, 3, 31 and 30.  No evidence of gingival erythema or swelling.  No periapical abscess to drain.  Nonfocal neurologic exam without neurologic deficits.  Patient has no chest pain or shortness of breath.  And also had sinus x-rays performed at her PCP office on Monday to r/o a new sinusitis.  Patient has no nasal congestion, rhinorrhea or facial tenderness.  Low suspicion for acute infectious process.  She is afebrile, nonseptic, non-ill-appearing.  Low suspicion for facial nerve neuralgia as patient has no tenderness or paresthesias to her face.  I have discussed patient with my attending, Dr. Betsey Holiday.  He recommends chest x-ray and EKG to r/o atypical cardiac symptoms.  No eye symptoms to suggest trigeminal neuralgia.  Low suspicion for Ludwick's angina or deep space infection.  Normal neurologic exam without neurologic deficits.  Patient with negative chest x-ray and negative troponin.  Low suspicion for emergent pathology causing patient's symptoms at this time.  Patient with pain  improved with 2 hydrocodone as well as 2 lidocaine dental pops.  Discussed with patient follow-up with endodontist.  States she has an appointment in approximately 3 hours from her discharge.  Patient is hemodynamically stable and appropriate for DC home at this time.  I discussed strict return precautions.  Patient voiced understanding and is agreeable for follow-up.    Final Clinical Impressions(s) / ED Diagnoses   Final diagnoses:  Pain, dental    ED Discharge Orders    None       Byrdie Miyazaki A, PA-C 11/20/18 9924    Orpah Greek, MD 11/27/18 2351

## 2018-11-20 NOTE — ED Triage Notes (Signed)
C/o tooth ache onset yest, states she was seen by her dentist and was given vicodan and tramadol however its not working.

## 2018-12-27 DIAGNOSIS — H25043 Posterior subcapsular polar age-related cataract, bilateral: Secondary | ICD-10-CM | POA: Diagnosis not present

## 2018-12-27 DIAGNOSIS — H5213 Myopia, bilateral: Secondary | ICD-10-CM | POA: Diagnosis not present

## 2019-01-13 DIAGNOSIS — Z1389 Encounter for screening for other disorder: Secondary | ICD-10-CM | POA: Diagnosis not present

## 2019-01-13 DIAGNOSIS — I10 Essential (primary) hypertension: Secondary | ICD-10-CM | POA: Diagnosis not present

## 2019-01-13 DIAGNOSIS — E559 Vitamin D deficiency, unspecified: Secondary | ICD-10-CM | POA: Diagnosis not present

## 2019-01-13 DIAGNOSIS — G720 Drug-induced myopathy: Secondary | ICD-10-CM | POA: Diagnosis not present

## 2019-01-13 DIAGNOSIS — Z0000 Encounter for general adult medical examination without abnormal findings: Secondary | ICD-10-CM | POA: Diagnosis not present

## 2019-01-13 DIAGNOSIS — Z1159 Encounter for screening for other viral diseases: Secondary | ICD-10-CM | POA: Diagnosis not present

## 2019-01-13 DIAGNOSIS — E785 Hyperlipidemia, unspecified: Secondary | ICD-10-CM | POA: Diagnosis not present

## 2019-01-13 DIAGNOSIS — M8580 Other specified disorders of bone density and structure, unspecified site: Secondary | ICD-10-CM | POA: Diagnosis not present

## 2019-01-15 ENCOUNTER — Other Ambulatory Visit: Payer: Self-pay | Admitting: Family Medicine

## 2019-01-15 DIAGNOSIS — M8580 Other specified disorders of bone density and structure, unspecified site: Secondary | ICD-10-CM

## 2019-01-15 DIAGNOSIS — Z1231 Encounter for screening mammogram for malignant neoplasm of breast: Secondary | ICD-10-CM

## 2019-01-27 ENCOUNTER — Telehealth: Payer: Self-pay

## 2019-01-27 NOTE — Telephone Encounter (Signed)
I contacted the pt and advised due to the covid 19 concerns our office is limiting the number of pt's coming into the clinic. Pt was advised we are trying to work out have video visits available for our new patients and she was able to confirm she had accessibility to a smart phone/ computer with video. Pt advised I would touch base once I have further information on this scheduling.

## 2019-01-27 NOTE — Telephone Encounter (Signed)
Left vm for pt advising due to covid 19 concerns our office will be limiting the number of pt's we see for the next two weeks. Pt advised to call back to further discuss her options.

## 2019-01-27 NOTE — Telephone Encounter (Signed)
Pt returned call. Please call as soon as available.

## 2019-01-28 ENCOUNTER — Ambulatory Visit: Payer: Medicare HMO | Admitting: Neurology

## 2019-01-28 ENCOUNTER — Encounter

## 2019-01-28 NOTE — Telephone Encounter (Signed)
I called the pt and advised due to covid-19 concerns our office is limiting the number of patient's coming into the clinic. Our office is providing video visits during this time for new pt's as appropriate. The telephone/video visits will be billed through insurance and due to hippa concerns is not as secure as a face to face encounter. However, once a verbal consent is provided we can schedule this appointment for you.   Pt consented to virtual visit and has been sent a new pt video link. Pt advised to call our office if she had any issues with the link.   I have reviewed medications, allergies, pharmacy and history with the pt.

## 2019-01-29 ENCOUNTER — Encounter: Payer: Self-pay | Admitting: Neurology

## 2019-01-29 ENCOUNTER — Other Ambulatory Visit: Payer: Self-pay

## 2019-01-29 ENCOUNTER — Telehealth: Payer: Self-pay | Admitting: Neurology

## 2019-01-29 ENCOUNTER — Telehealth (INDEPENDENT_AMBULATORY_CARE_PROVIDER_SITE_OTHER): Payer: Medicare HMO | Admitting: Neurology

## 2019-01-29 DIAGNOSIS — G4489 Other headache syndrome: Secondary | ICD-10-CM

## 2019-01-29 DIAGNOSIS — G501 Atypical facial pain: Secondary | ICD-10-CM | POA: Diagnosis not present

## 2019-01-29 HISTORY — DX: Atypical facial pain: G50.1

## 2019-01-29 MED ORDER — GABAPENTIN 100 MG PO CAPS: 100.0000 mg | ORAL_CAPSULE | Freq: Three times a day (TID) | ORAL | 3 refills | Status: DC

## 2019-01-29 MED ORDER — ALPRAZOLAM 0.5 MG PO TABS: ORAL_TABLET | ORAL | 0 refills | Status: AC

## 2019-01-29 NOTE — Progress Notes (Signed)
Virtual Visit via Video Note  I connected with Verdene Lennert on 01/29/19 at  3:00 PM EDT by a video enabled telemedicine application and verified that I am speaking with the correct person using two identifiers.   I discussed the limitations of evaluation and management by telemedicine and the availability of in person appointments. The patient expressed understanding and agreed to proceed.  History of Present Illness: Dawn Rowe is a 73 year old left-handed white female with a history of onset of facial pain that has been present over the last 18 months.  The patient indicates there has been some gradual worsening of the pain as time has gone on.  The patient was seen by her endodontist and dentist, she had 2 root canal procedures done without benefit.  The patient reports that she has pain in the maxillary and mandibular areas on the right associated with a dull achy pain most of the time, that sometimes the pain is more acute but never has a jabbing or electric shock sensation.  About 50% of the time she has pain that is severe enough that she is suffering some.  The patient occasionally may note pain down into the ear on the right and sometimes into the upper neck on the right.  The patient denies any significant neck stiffness.  The indicates that when she makes contact with the teeth from the upper and lower jaws, sometimes there is pain.  At times she cannot chew on the right, she has to chew on the left.  She has taken Ultram and hydrocodone for pain without much benefit.  She denies any numbness on the face, arms, or legs.  She denies any weakness of extremities.  She has not had any balance changes or difficulty controlling the bowels or the bladder.  She denies any vision changes, double vision, or significant dizziness or vertigo.  She is sent to this office for an evaluation.   Observations/Objective: With video evaluation, the patient appears to be alert and cooperative, she is  responding appropriately.  Speech is well enunciated, not aphasic or dysarthric.  The patient has symmetric facial expressions, she has full extraocular movements.  Pupils are round and equal.  She is able to protrude the tongue in the midline, good lateral movement to the tongue.  She appears to have good range of movement the cervical spine.  Her ability to ambulate is normal, tandem gait is normal.  Romberg is negative.  Finger-nose-finger and heel shin are symmetric and normal.  Assessment and Plan: 1.  Atypical facial pain, right  The etiology of the pain is not clear, the description of the pain is not fully consistent with trigeminal neuralgia.  The patient will be placed on gabapentin taking 100 mg 3 times daily.  She will undergo MRI of the brain with and without gadolinium enhancement.  The patient is claustrophobic, alprazolam was called in.  The patient will call for any dose adjustments of the gabapentin.  Follow Up Instructions: Follow-up in 3 months.   I discussed the assessment and treatment plan with the patient. The patient was provided an opportunity to ask questions and all were answered. The patient agreed with the plan and demonstrated an understanding of the instructions.   The patient was advised to call back or seek an in-person evaluation if the symptoms worsen or if the condition fails to improve as anticipated.  I provided 30 minutes of non-face-to-face time during this encounter.   Kathrynn Ducking, MD

## 2019-01-29 NOTE — Telephone Encounter (Signed)
Called patient and LVM to schedule follow-up per Dr. Jannifer Franklin' note.

## 2019-01-31 ENCOUNTER — Telehealth: Payer: Self-pay | Admitting: Neurology

## 2019-01-31 NOTE — Telephone Encounter (Signed)
[employer] Josem Kaufmann: [id number] (exp. 01/31/19 to 07/30/19) order sent to [facility]. They will reach out to the pt to schedule.

## 2019-02-05 ENCOUNTER — Telehealth: Payer: Self-pay | Admitting: Neurology

## 2019-02-05 NOTE — Telephone Encounter (Signed)
I called the patient.  The patient is still having some facial pain, we will go up on the gabapentin taking 200 mg 3 times daily, if this is not helpful within the next week she is to contact our office and we will go to 300 mg capsules.

## 2019-02-05 NOTE — Telephone Encounter (Signed)
Pt has called to inform RN Jinny Blossom that the current dose of gabapentin (NEURONTIN) 100 MG capsule is not helping.  Pt is asking for a call to discuss other options or an increase in this medication

## 2019-02-05 NOTE — Telephone Encounter (Signed)
Patient is scheduled at [facility] for 04/05/19.

## 2019-02-17 ENCOUNTER — Telehealth: Payer: Self-pay | Admitting: Neurology

## 2019-02-17 MED ORDER — GABAPENTIN 300 MG PO CAPS: 300.0000 mg | ORAL_CAPSULE | Freq: Two times a day (BID) | ORAL | 3 refills | Status: DC

## 2019-02-17 MED ORDER — GABAPENTIN 300 MG PO CAPS: 300.0000 mg | ORAL_CAPSULE | Freq: Three times a day (TID) | ORAL | 3 refills | Status: AC

## 2019-02-17 MED ORDER — GABAPENTIN 100 MG PO CAPS: 200.0000 mg | ORAL_CAPSULE | Freq: Three times a day (TID) | ORAL | 3 refills | Status: DC

## 2019-02-17 NOTE — Telephone Encounter (Signed)
I attempted to reach the pt to advise of medication change. Left a vm for the pt to return my call. [facility] # provided.

## 2019-02-17 NOTE — Telephone Encounter (Signed)
I reviewed the chart and on 02/05/19 Dr. Jannifer Franklin noted patient was to increase to gabapentin 200 mg 3 time per daily.  I contacted the pt and left a vm, ok per dpr advising rx has been submitted to [facility] on [address].   Pt was given [facility] # to call back if she had any questions.

## 2019-02-17 NOTE — Telephone Encounter (Signed)
Noted. Thanks.

## 2019-02-17 NOTE — Telephone Encounter (Signed)
Pt called back in and I provided her with new information

## 2019-02-17 NOTE — Telephone Encounter (Signed)
Pt called and stated that she was given gabapentin (NEURONTIN) 100 MG capsule and after a week she was told to double it to 200MG . Now pt is out of her medication after she took them this morning and is needing a refill but she is not due for a refill. Please advise.

## 2019-02-17 NOTE — Addendum Note (Signed)
Addended by: Star Age on: 02/17/2019 04:02 PM   Modules accepted: Orders

## 2019-02-17 NOTE — Telephone Encounter (Addendum)
Pt called in stating [facility] could not refill the [facility] RX.  I contacted [facility]. Pharmacist states the two 100 mg tablets 3 times per day(total of 600 mg) is currently not covered.  Pharmacist state plan only covers 3 tablets max daily.  Will send message to covering MD and request the rx be changed to 300 mg 1 tablet twice daily.

## 2019-02-17 NOTE — Telephone Encounter (Signed)
Will change gabapentin prescription to 300 mg twice a day.

## 2019-02-17 NOTE — Addendum Note (Signed)
Addended by: Kathrynn Ducking on: 02/17/2019 06:04 PM   Modules accepted: Orders

## 2019-02-25 DIAGNOSIS — D2272 Melanocytic nevi of left lower limb, including hip: Secondary | ICD-10-CM | POA: Diagnosis not present

## 2019-02-25 DIAGNOSIS — M67449 Ganglion, unspecified hand: Secondary | ICD-10-CM | POA: Diagnosis not present

## 2019-02-25 DIAGNOSIS — D2271 Melanocytic nevi of right lower limb, including hip: Secondary | ICD-10-CM | POA: Diagnosis not present

## 2019-02-25 DIAGNOSIS — L918 Other hypertrophic disorders of the skin: Secondary | ICD-10-CM | POA: Diagnosis not present

## 2019-02-25 DIAGNOSIS — D485 Neoplasm of uncertain behavior of skin: Secondary | ICD-10-CM | POA: Diagnosis not present

## 2019-02-25 DIAGNOSIS — L570 Actinic keratosis: Secondary | ICD-10-CM | POA: Diagnosis not present

## 2019-02-25 DIAGNOSIS — D225 Melanocytic nevi of trunk: Secondary | ICD-10-CM | POA: Diagnosis not present

## 2019-02-25 DIAGNOSIS — L821 Other seborrheic keratosis: Secondary | ICD-10-CM | POA: Diagnosis not present

## 2019-02-25 DIAGNOSIS — D0472 Carcinoma in situ of skin of left lower limb, including hip: Secondary | ICD-10-CM | POA: Diagnosis not present

## 2019-02-25 DIAGNOSIS — D1801 Hemangioma of skin and subcutaneous tissue: Secondary | ICD-10-CM | POA: Diagnosis not present

## 2019-03-07 DIAGNOSIS — H268 Other specified cataract: Secondary | ICD-10-CM | POA: Diagnosis not present

## 2019-03-07 DIAGNOSIS — H25811 Combined forms of age-related cataract, right eye: Secondary | ICD-10-CM | POA: Diagnosis not present

## 2019-03-07 DIAGNOSIS — H25041 Posterior subcapsular polar age-related cataract, right eye: Secondary | ICD-10-CM | POA: Diagnosis not present

## 2019-03-14 ENCOUNTER — Other Ambulatory Visit: Payer: Self-pay | Admitting: Neurology

## 2019-03-18 ENCOUNTER — Telehealth: Payer: Self-pay | Admitting: Neurology

## 2019-03-18 NOTE — Telephone Encounter (Signed)
Events noted

## 2019-03-18 NOTE — Telephone Encounter (Signed)
Pt is asking for a call from [facility] to discuss her upcoming MRI appointment.  Pt states there have been changes she needs to discuss with RN Jinny Blossom

## 2019-03-18 NOTE — Telephone Encounter (Signed)
Called patient back. She states a few weeks ago She saw Dr. Payton Emerald who did another root canal on tooth. After root canal, they had to pull tooth last Friday. Pain has now resolved. She is not having any more issues. She requested MRI be cx. I advised her to call [facility] at [phone] to cx. She will call our office back if she ever needs to f/u in the future. Advised I will send FYI to Dr. Jannifer Franklin. She verbalized understanding.

## 2019-04-05 ENCOUNTER — Other Ambulatory Visit: Payer: Medicare HMO

## 2019-04-21 DIAGNOSIS — E7800 Pure hypercholesterolemia, unspecified: Secondary | ICD-10-CM | POA: Diagnosis not present

## 2019-04-22 DIAGNOSIS — R300 Dysuria: Secondary | ICD-10-CM | POA: Diagnosis not present

## 2019-04-22 DIAGNOSIS — Z6825 Body mass index (BMI) 25.0-25.9, adult: Secondary | ICD-10-CM | POA: Diagnosis not present

## 2019-04-22 DIAGNOSIS — N3001 Acute cystitis with hematuria: Secondary | ICD-10-CM | POA: Diagnosis not present

## 2019-04-23 DIAGNOSIS — R69 Illness, unspecified: Secondary | ICD-10-CM | POA: Diagnosis not present

## 2019-05-02 ENCOUNTER — Emergency Department (HOSPITAL_COMMUNITY): Payer: Medicare HMO

## 2019-05-02 ENCOUNTER — Encounter (HOSPITAL_COMMUNITY): Payer: Self-pay

## 2019-05-02 ENCOUNTER — Other Ambulatory Visit: Payer: Self-pay

## 2019-05-02 ENCOUNTER — Emergency Department (HOSPITAL_COMMUNITY)
Admission: EM | Admit: 2019-05-02 | Discharge: 2019-05-03 | Disposition: A | Discharge status: Home / Self Care | Payer: Medicare HMO | Attending: Emergency Medicine | Admitting: Emergency Medicine

## 2019-05-02 DIAGNOSIS — R300 Dysuria: Secondary | ICD-10-CM | POA: Diagnosis not present

## 2019-05-02 DIAGNOSIS — N3091 Cystitis, unspecified with hematuria: Secondary | ICD-10-CM | POA: Diagnosis not present

## 2019-05-02 DIAGNOSIS — E876 Hypokalemia: Secondary | ICD-10-CM

## 2019-05-02 DIAGNOSIS — R109 Unspecified abdominal pain: Secondary | ICD-10-CM | POA: Diagnosis not present

## 2019-05-02 DIAGNOSIS — Z79899 Other long term (current) drug therapy: Secondary | ICD-10-CM | POA: Insufficient documentation

## 2019-05-02 DIAGNOSIS — N3090 Cystitis, unspecified without hematuria: Secondary | ICD-10-CM | POA: Diagnosis not present

## 2019-05-02 LAB — URINALYSIS, ROUTINE W REFLEX MICROSCOPIC
Bacteria, UA: NONE SEEN
Bilirubin Urine: NEGATIVE
Glucose, UA: NEGATIVE mg/dL
Ketones, ur: 5 mg/dL — AB
Nitrite: NEGATIVE
Protein, ur: 30 mg/dL — AB
RBC / HPF: >50 RBC/hpf — ABNORMAL HIGH (ref 0–5)
Specific Gravity, Urine: 1.008 (ref 1.005–1.030)
WBC, UA: >50 WBC/hpf — ABNORMAL HIGH (ref 0–5)
pH: 6 (ref 5.0–8.0)

## 2019-05-02 LAB — COMPREHENSIVE METABOLIC PANEL
ALT: 24 U/L (ref 0–44)
AST: 26 U/L (ref 15–41)
Albumin: 4.9 g/dL (ref 3.5–5.0)
Alkaline Phosphatase: 91 U/L (ref 38–126)
Anion gap: 15 (ref 5–15)
BUN: 12 mg/dL (ref 8–23)
CO2: 26 mmol/L (ref 22–32)
Calcium: 9.7 mg/dL (ref 8.9–10.3)
Chloride: 92 mmol/L — ABNORMAL LOW (ref 98–111)
Creatinine, Ser: 0.72 mg/dL (ref 0.44–1.00)
GFR calc Af Amer: >60 mL/min (ref >60)
GFR calc non Af Amer: >60 mL/min (ref >60)
Glucose, Bld: 103 mg/dL — ABNORMAL HIGH (ref 70–99)
Potassium: 3.2 mmol/L — ABNORMAL LOW (ref 3.5–5.1)
Sodium: 133 mmol/L — ABNORMAL LOW (ref 135–145)
Total Bilirubin: 0.6 mg/dL (ref 0.3–1.2)
Total Protein: 8.9 g/dL — ABNORMAL HIGH (ref 6.5–8.1)

## 2019-05-02 LAB — CBC
HCT: 48.2 % — ABNORMAL HIGH (ref 36.0–46.0)
Hemoglobin: 15.9 g/dL — ABNORMAL HIGH (ref 12.0–15.0)
MCH: 30.5 pg (ref 26.0–34.0)
MCHC: 33 g/dL (ref 30.0–36.0)
MCV: 92.5 fL (ref 80.0–100.0)
Platelets: 328 10*3/uL (ref 150–400)
RBC: 5.21 MIL/uL — ABNORMAL HIGH (ref 3.87–5.11)
RDW: 13.7 % (ref 11.5–15.5)
WBC: 15.2 10*3/uL — ABNORMAL HIGH (ref 4.0–10.5)
nRBC: 0 % (ref 0.0–0.2)

## 2019-05-02 LAB — LIPASE, BLOOD: Lipase: 27 U/L (ref 11–51)

## 2019-05-02 MED ORDER — SODIUM CHLORIDE 0.9% FLUSH
3.0000 mL | Freq: Once | INTRAVENOUS | Status: AC
  Administered 2019-05-02: 3 mL via INTRAVENOUS

## 2019-05-02 NOTE — ED Provider Notes (Signed)
Dawn Rowe Provider Note  CSN: [id number] Arrival date & time: 05/02/19 1700  Chief Complaint(s) Abnormal Lab  HPI Dawn Rowe Dawn Rowe is a 73 y.o. female   The history is provided by the patient.  Dysuria Pain quality:  Burning Pain severity:  Moderate Onset quality:  Gradual Duration:  4 days Timing:  Intermittent Progression:  Waxing and waning Chronicity:  Recurrent Recent urinary tract infections: no   Relieved by:  Nothing Worsened by:  Nothing Urinary symptoms: foul-smelling urine and hematuria    Had recent colonoscopy last year that was clean.  Past Medical History Past Medical History:  Diagnosis Date  . Atypical facial pain 01/29/2019   Right maxillary and mandible  . Hypertension    Patient Active Problem List   Diagnosis Date Noted  . Atypical facial pain 01/29/2019   Home Medication(s) Prior to Admission medications   Medication Sig Start Date End Date Taking? Authorizing Provider  amoxicillin-clavulanate (AUGMENTIN) 875-125 MG tablet Take 1 tablet by mouth 2 (two) times a day. 05/02/19  Yes [provider]  Biotin 10000 MCG TABS Take by mouth.   Yes [provider]  Cholecalciferol (VITAMIN D3 PO) Take 1 tablet by mouth daily.   Yes [provider]  Coenzyme Q10 (CO Q 10 PO) Take 100 mg by mouth daily.   Yes [provider]  ezetimibe (ZETIA) 10 MG tablet Take 10 mg by mouth daily. 04/29/19  Yes [provider]  hydrochlorothiazide (HYDRODIURIL) 25 MG tablet Take 25 mg by mouth daily. 06/15/18  Yes [provider]  Multiple Vitamins-Minerals (MULTIVITAMIN ADULT PO) Take 1 tablet by mouth daily.   Yes [provider]  naproxen sodium (ALEVE) 220 MG tablet Take 440 mg by mouth daily as needed (pain).   Yes [provider]  Omega-3 Fatty Acids (FISH OIL) 1200 MG CAPS Take 1 capsule by mouth daily.   Yes [provider]  ALPRAZolam Duanne Moron) 0.5 MG  tablet Take 2 tablets approximately 45 minutes prior to the MRI study, take a third tablet if needed. Patient not taking: Reported on 05/02/2019 01/29/19   Dawn Ducking, MD  gabapentin (NEURONTIN) 300 MG capsule Take 1 capsule (300 mg total) by mouth 3 (three) times daily. Patient not taking: Reported on 05/02/2019 02/17/19   Dawn Ducking, MD                                                                                                                                    Past Surgical History Past Surgical History:  Procedure Laterality Date  . SHOULDER SURGERY Bilateral   . TONSILLECTOMY     Family History Family History  Problem Relation Age of Onset  . Lymphoma Mother   . Lung cancer Father   . Parkinson's disease Brother   . Multiple sclerosis Daughter   . Colon cancer Neg Hx   . Colon polyps Neg Hx   .  Esophageal cancer Neg Hx   . Rectal cancer Neg Hx   . Stomach cancer Neg Hx     Social History Social History   Tobacco Use  . Smoking status: Never Smoker  . Smokeless tobacco: Never Used  Substance Use Topics  . Alcohol use: Yes    Alcohol/week: 4.0 standard drinks    Types: 4 Glasses of wine per week  . Drug use: Not Currently   Allergies Decongestant [pseudoephedrine hcl]  Review of Systems Review of Systems  Genitourinary: Positive for dysuria.   All other systems are reviewed and are negative for acute change except as noted in the HPI  Physical Exam Vital Signs  I have reviewed the triage vital signs BP 129/77 (BP Location: Left Arm)   Pulse 91   Temp 99.4 F (37.4 C)   Resp 18   Ht 5\' 3"  (1.6 m)   Wt 64 kg   SpO2 97%   BMI 24.98 kg/m   Physical Exam Vitals signs reviewed.  Constitutional:      General: She is not in acute distress.    Appearance: She is well-developed. She is not diaphoretic.  HENT:     Head: Normocephalic and atraumatic.     Right Ear: External ear normal.     Left Ear: External ear normal.     Nose: Nose normal.   Eyes:     General: No scleral icterus.    Conjunctiva/sclera: Conjunctivae normal.  Neck:     Musculoskeletal: Normal range of motion.     Trachea: Phonation normal.  Cardiovascular:     Rate and Rhythm: Normal rate and regular rhythm.  Pulmonary:     Effort: Pulmonary effort is normal. No respiratory distress.     Breath sounds: No stridor.  Abdominal:     General: There is no distension.     Tenderness: There is abdominal tenderness in the periumbilical area, suprapubic area and left lower quadrant. There is no guarding or rebound.  Musculoskeletal: Normal range of motion.  Neurological:     Mental Status: She is alert and oriented to person, place, and time.  Psychiatric:        Behavior: Behavior normal.     ED Results and Treatments Labs (all labs ordered are listed, but only abnormal results are displayed) Labs Reviewed  URINALYSIS, ROUTINE W REFLEX MICROSCOPIC - Abnormal; Notable for the following components:      Result Value   APPearance HAZY (*)    Hgb urine dipstick LARGE (*)    Ketones, ur 5 (*)    Protein, ur 30 (*)    Leukocytes,Ua LARGE (*)    RBC / HPF >50 (*)    WBC, UA >50 (*)    Non Squamous Epithelial 11-20 (*)    All other components within normal limits  LIPASE, BLOOD  COMPREHENSIVE METABOLIC PANEL  CBC  EKG  EKG Interpretation  Date/Time:    Ventricular Rate:    PR Interval:    QRS Duration:   QT Interval:    QTC Calculation:   R Axis:     Text Interpretation:        Radiology No results found.  Pertinent labs & imaging results that were available during my care of the patient were reviewed by me and considered in my medical decision making (see chart for details).  Medications Ordered in ED Medications  sodium chloride flush (NS) 0.9 % injection 3 mL (3 mLs Intravenous Given 05/02/19 2142)                                                                                                                                     Procedures Procedures  (including critical care time)  Medical Decision Making / ED Course I have reviewed the nursing notes for this encounter and the patient's prior records (if available in EHR or on provided paperwork).  UTI vs renal stone vs Diverticulitis vs appendicitis  Screening labs and UA ordered.  UA suspicious for stone. Will get CT stone study.  Patient care turned over to [place], [place]  at 2200. Patient case and results discussed in detail; please see their note for further ED managment.          This chart was dictated using voice recognition software.  Despite best efforts to proofread,  errors can occur which can change the documentation meaning.   Fatima Blank, MD 05/02/19 2214

## 2019-05-02 NOTE — ED Triage Notes (Addendum)
Patient states she saw her PCp last week for UTI symptoms. Patient had a negative urine culture. Patient began having abdominal pain, dysuria, and diarrhea yesterday only. Patient saw her PCP again today and blood work was completed.   patient was called for a WBC- 15.6. patient also took Augmentin 1 tab today.

## 2019-05-03 DIAGNOSIS — N3091 Cystitis, unspecified with hematuria: Secondary | ICD-10-CM | POA: Diagnosis not present

## 2019-05-03 DIAGNOSIS — E876 Hypokalemia: Secondary | ICD-10-CM | POA: Diagnosis not present

## 2019-05-03 DIAGNOSIS — Z79899 Other long term (current) drug therapy: Secondary | ICD-10-CM | POA: Diagnosis not present

## 2019-05-03 MED ORDER — CEPHALEXIN 500 MG PO CAPS
500.0000 mg | ORAL_CAPSULE | Freq: Once | ORAL | Status: AC
  Administered 2019-05-03: 500 mg via ORAL
  Filled 2019-05-03: qty 1

## 2019-05-03 MED ORDER — POTASSIUM CHLORIDE CRYS ER 20 MEQ PO TBCR
40.0000 meq | EXTENDED_RELEASE_TABLET | Freq: Once | ORAL | Status: AC
  Administered 2019-05-03: 40 meq via ORAL
  Filled 2019-05-03: qty 2

## 2019-05-03 MED ORDER — FLUCONAZOLE 150 MG PO TABS: 150.0000 mg | ORAL_TABLET | Freq: Once | ORAL | 0 refills | Status: AC

## 2019-05-03 MED ORDER — CEPHALEXIN 500 MG PO CAPS: 500.0000 mg | ORAL_CAPSULE | Freq: Two times a day (BID) | ORAL | 0 refills | Status: AC

## 2019-05-03 NOTE — Discharge Instructions (Signed)
Your urine today was suggestive of a urinary tract infection.  Sometimes infection can cause irritation and bleeding of your bladder resulting in blood in your urine.  This should resolve when your infection improves.  Your CT scan today did not show evidence of kidney stone.  Your appendix appeared normal.  You had no evidence of diverticulitis or colitis.  Take Keflex as prescribed until finished.  You had some yeast noted in your urine.  Take 1 tablet of Diflucan upon completion of Keflex.  We recommend follow-up with your primary care doctor in 1 week for recheck, to ensure resolution of symptoms.  You may return to the ED for new or concerning symptoms.

## 2019-05-03 NOTE — ED Provider Notes (Signed)
12:15 AM Patient care assumed at shift change from MD Cardama.  Patient presenting for dysuria.  Previously negative urine culture 1 week ago.  She did complete a course of antibiotics without symptomatic improvement.  Suspect this to be Macrobid as patient took it twice a day for 5 days.  Today, patient has a leukocytosis of 15.2.  She is afebrile without tachycardia or tachypnea.  She does not meet criteria for SIRS or sepsis at this time.  Urinalysis is suggestive of infection given pyuria.  Red blood cells also seen.  She underwent CT renal stone study to evaluate for possible kidney stone.  CT imaging is negative.  Appendix also visualized and noted to be normal.  Urine findings most consistent with hemorrhagic cystitis.  Plan for discharge on 1 week course of Keflex.  Given yeast in urine, she was given a tablet of Diflucan to take upon completion of these antibiotics.  Encouraged follow-up with her primary care doctor.  Return precautions discussed and provided. Patient discharged in stable condition with no unaddressed concerns.   Results for orders placed or performed during the hospital encounter of 05/02/19  Lipase, blood  Result Value Ref Range   Lipase 27 11 - 51 U/L  Comprehensive metabolic panel  Result Value Ref Range   Sodium 133 (L) 135 - 145 mmol/L   Potassium 3.2 (L) 3.5 - 5.1 mmol/L   Chloride 92 (L) 98 - 111 mmol/L   CO2 26 22 - 32 mmol/L   Glucose, Bld 103 (H) 70 - 99 mg/dL   BUN 12 8 - 23 mg/dL   Creatinine, Ser 0.72 0.44 - 1.00 mg/dL   Calcium 9.7 8.9 - 10.3 mg/dL   Total Protein 8.9 (H) 6.5 - 8.1 g/dL   Albumin 4.9 3.5 - 5.0 g/dL   AST 26 15 - 41 U/L   ALT 24 0 - 44 U/L   Alkaline Phosphatase 91 38 - 126 U/L   Total Bilirubin 0.6 0.3 - 1.2 mg/dL   GFR calc non Af Amer >60 >60 mL/min   GFR calc Af Amer >60 >60 mL/min   Anion gap 15 5 - 15  CBC  Result Value Ref Range   WBC 15.2 (H) 4.0 - 10.5 K/uL   RBC 5.21 (H) 3.87 - 5.11 MIL/uL   Hemoglobin 15.9 (H) 12.0 -  15.0 g/dL   HCT 48.2 (H) 36.0 - 46.0 %   MCV 92.5 80.0 - 100.0 fL   MCH 30.5 26.0 - 34.0 pg   MCHC 33.0 30.0 - 36.0 g/dL   RDW 13.7 11.5 - 15.5 %   Platelets 328 150 - 400 K/uL   nRBC 0.0 0.0 - 0.2 %  Urinalysis, Routine w reflex microscopic  Result Value Ref Range   Color, Urine YELLOW YELLOW   APPearance HAZY (A) CLEAR   Specific Gravity, Urine 1.008 1.005 - 1.030   pH 6.0 5.0 - 8.0   Glucose, UA NEGATIVE NEGATIVE mg/dL   Hgb urine dipstick LARGE (A) NEGATIVE   Bilirubin Urine NEGATIVE NEGATIVE   Ketones, ur 5 (A) NEGATIVE mg/dL   Protein, ur 30 (A) NEGATIVE mg/dL   Nitrite NEGATIVE NEGATIVE   Leukocytes,Ua LARGE (A) NEGATIVE   RBC / HPF >50 (H) 0 - 5 RBC/hpf   WBC, UA >50 (H) 0 - 5 WBC/hpf   Bacteria, UA NONE SEEN NONE SEEN   Squamous Epithelial / LPF 0-5 0 - 5   Mucus PRESENT    Budding Yeast PRESENT  Non Squamous Epithelial 11-20 (A) NONE SEEN   Ct Renal Stone Study  Result Date: 05/02/2019 CLINICAL DATA:  73 year old female with dysuria and flank pain. Concern for kidney stones. EXAM: CT ABDOMEN AND PELVIS WITHOUT CONTRAST TECHNIQUE: Multidetector CT imaging of the abdomen and pelvis was performed following the standard protocol without IV contrast. COMPARISON:  None. FINDINGS: Evaluation of this exam is limited in the absence of intravenous contrast. Lower chest: The visualized lung bases are clear. No intra-abdominal free air or free fluid. Hepatobiliary: The liver is unremarkable. No intrahepatic biliary ductal dilatation. Probable sludge or hyper concentrated bile in the gallbladder. No pericholecystic fluid. Pancreas: Unremarkable. No pancreatic ductal dilatation or surrounding inflammatory changes. Spleen: Normal in size without focal abnormality. Adrenals/Urinary Tract: The adrenal glands are unremarkable. There is no hydronephrosis or nephrolithiasis on either side. Subcentimeter exophytic hypodense lesion from the inferior pole of the left kidney is not characterized,  possibly a cyst. The visualized ureters and urinary bladder appear unremarkable. Stomach/Bowel: There is sigmoid diverticulosis without active inflammatory changes. There is a small hiatal hernia. A 12 mm fatty lesion in the second portion of the duodenum, most consistent with a lipoma. There is no bowel obstruction or active inflammation. The appendix is normal. Vascular/Lymphatic: The abdominal aorta and IVC are grossly unremarkable on this noncontrast CT. No portal venous gas. There is no adenopathy. Reproductive: The uterus and ovaries are grossly unremarkable. No pelvic mass. Other: None Musculoskeletal: Degenerative changes of the spine. Multilevel disc desiccation and vacuum phenomena. No acute osseous pathology. IMPRESSION: 1. No acute intra-abdominal or pelvic pathology. No hydronephrosis or nephrolithiasis. 2. Sigmoid diverticulosis. No bowel obstruction or active inflammation. Normal appendix. Electronically Signed   By: Anner Crete M.D.   On: 05/02/2019 23:03      Antonietta Breach, PA-C 05/03/19 0040    Palumbo, April, MD 05/03/19 [phone]

## 2019-05-07 DIAGNOSIS — E871 Hypo-osmolality and hyponatremia: Secondary | ICD-10-CM | POA: Diagnosis not present

## 2019-05-07 DIAGNOSIS — D72829 Elevated white blood cell count, unspecified: Secondary | ICD-10-CM | POA: Diagnosis not present

## 2019-05-07 DIAGNOSIS — N3000 Acute cystitis without hematuria: Secondary | ICD-10-CM | POA: Diagnosis not present

## 2019-05-26 ENCOUNTER — Ambulatory Visit
Admission: RE | Admit: 2019-05-26 | Discharge: 2019-05-26 | Disposition: A | Discharge status: Home / Self Care | Payer: Medicare HMO | Source: Ambulatory Visit | Attending: Family Medicine | Admitting: Family Medicine

## 2019-05-26 ENCOUNTER — Other Ambulatory Visit: Payer: Self-pay

## 2019-05-26 DIAGNOSIS — Z1231 Encounter for screening mammogram for malignant neoplasm of breast: Secondary | ICD-10-CM

## 2019-05-26 DIAGNOSIS — Z780 Asymptomatic menopausal state: Secondary | ICD-10-CM | POA: Diagnosis not present

## 2019-05-26 DIAGNOSIS — M85851 Other specified disorders of bone density and structure, right thigh: Secondary | ICD-10-CM | POA: Diagnosis not present

## 2019-05-26 DIAGNOSIS — M8580 Other specified disorders of bone density and structure, unspecified site: Secondary | ICD-10-CM

## 2019-07-09 DIAGNOSIS — L820 Inflamed seborrheic keratosis: Secondary | ICD-10-CM | POA: Diagnosis not present

## 2019-07-09 DIAGNOSIS — D485 Neoplasm of uncertain behavior of skin: Secondary | ICD-10-CM | POA: Diagnosis not present

## 2019-07-09 DIAGNOSIS — B078 Other viral warts: Secondary | ICD-10-CM | POA: Diagnosis not present

## 2019-07-09 DIAGNOSIS — Z85828 Personal history of other malignant neoplasm of skin: Secondary | ICD-10-CM | POA: Diagnosis not present

## 2019-07-09 DIAGNOSIS — M7130 Other bursal cyst, unspecified site: Secondary | ICD-10-CM | POA: Diagnosis not present

## 2019-07-30 DIAGNOSIS — R69 Illness, unspecified: Secondary | ICD-10-CM | POA: Diagnosis not present

## 2019-07-30 DIAGNOSIS — R300 Dysuria: Secondary | ICD-10-CM | POA: Diagnosis not present

## 2019-07-30 DIAGNOSIS — E7800 Pure hypercholesterolemia, unspecified: Secondary | ICD-10-CM | POA: Diagnosis not present

## 2019-10-27 DIAGNOSIS — R69 Illness, unspecified: Secondary | ICD-10-CM | POA: Diagnosis not present

## 2019-11-07 IMAGING — MG DIGITAL SCREENING BILATERAL MAMMOGRAM WITH TOMO AND CAD
8 series · 9 of 24 positions shown · non-contrast
Comparison: Previous exam(s).

CLINICAL DATA: Screening.

EXAM:
DIGITAL SCREENING BILATERAL MAMMOGRAM WITH TOMO AND CAD

[L MLO synth-2D]
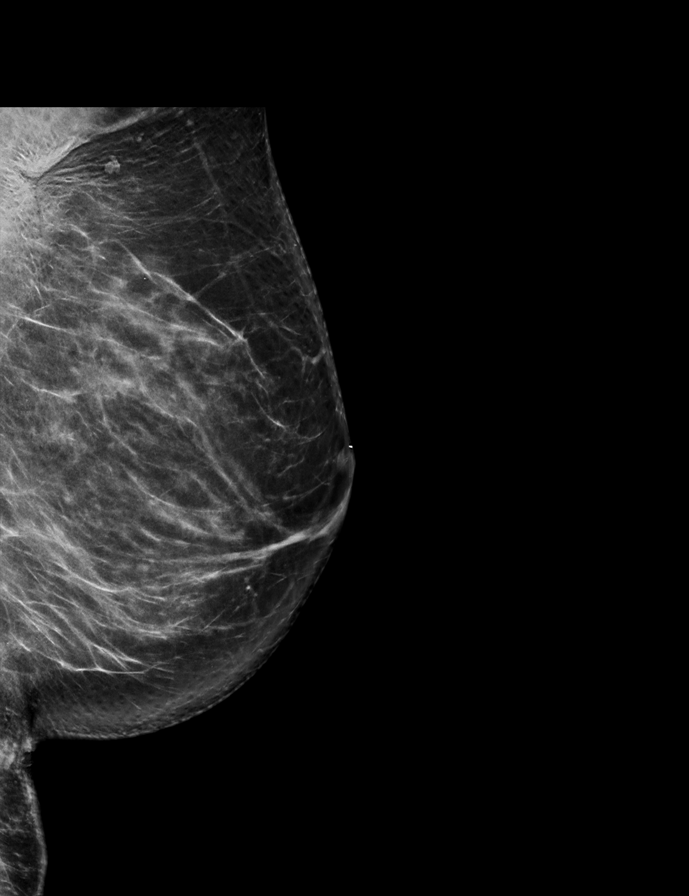

[R MLO synth-2D]
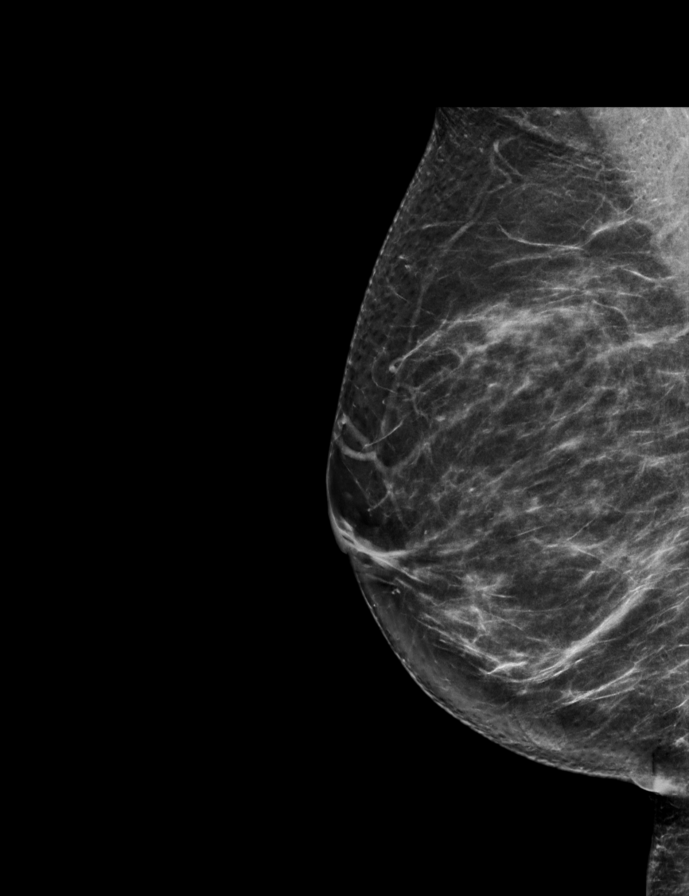

[R CC synth-2D]
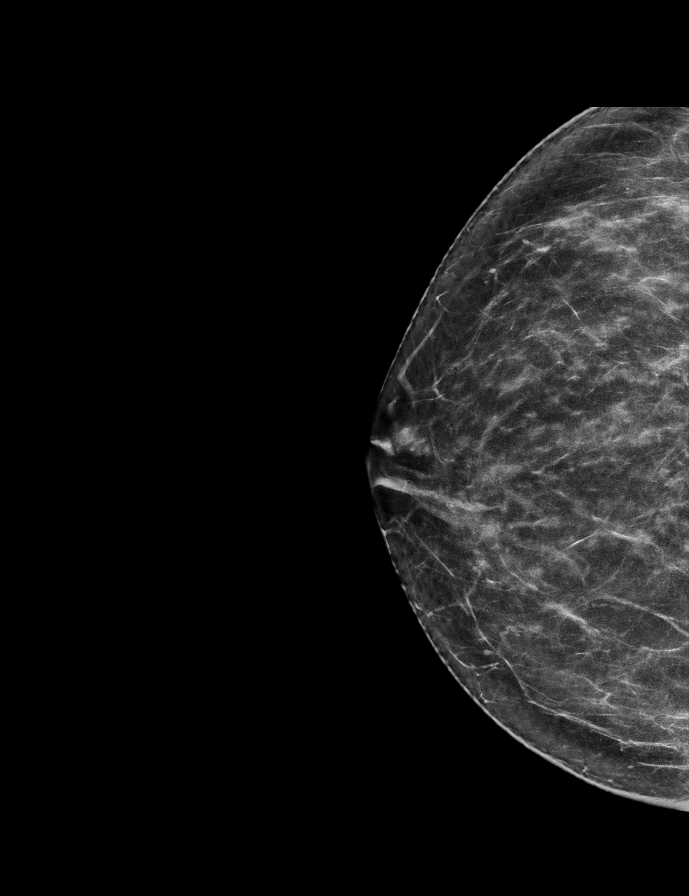

[L CC synth-2D]
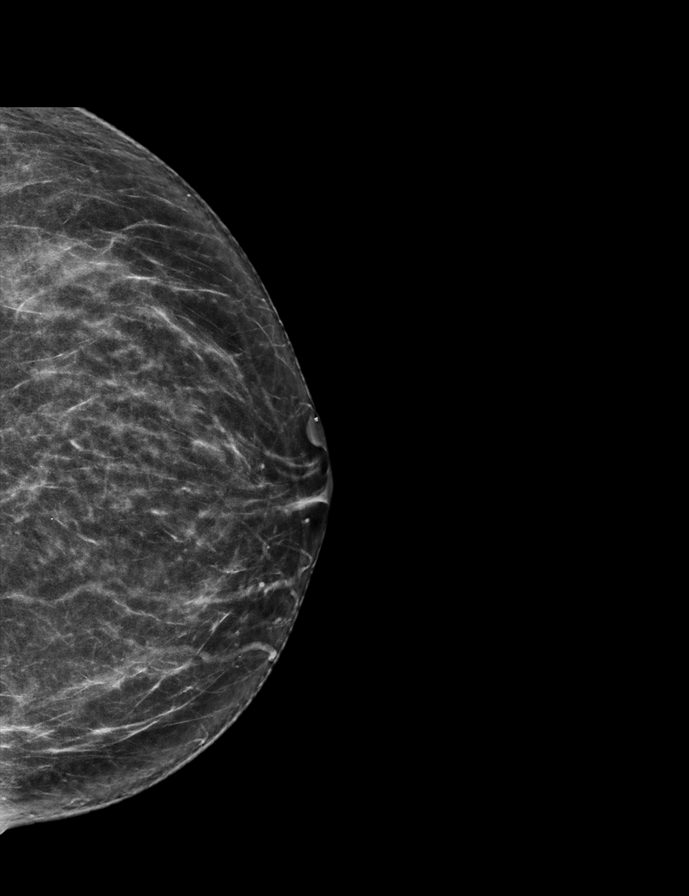

[R CC tomo · 2 of 69 frames shown]
[frame 23/69]
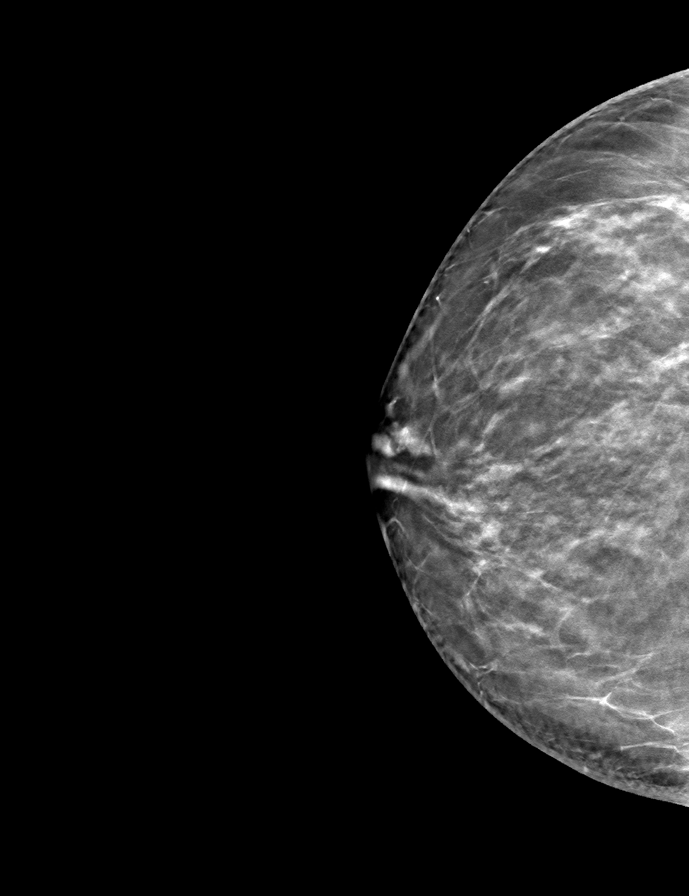
[frame 35/69]
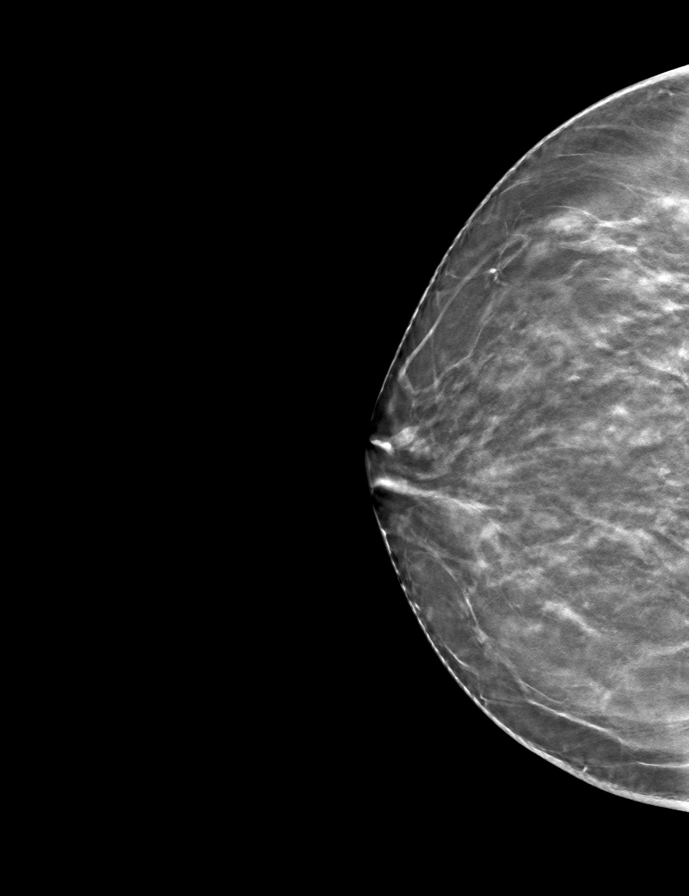

[L CC tomo · tomo slice 35/68.0]
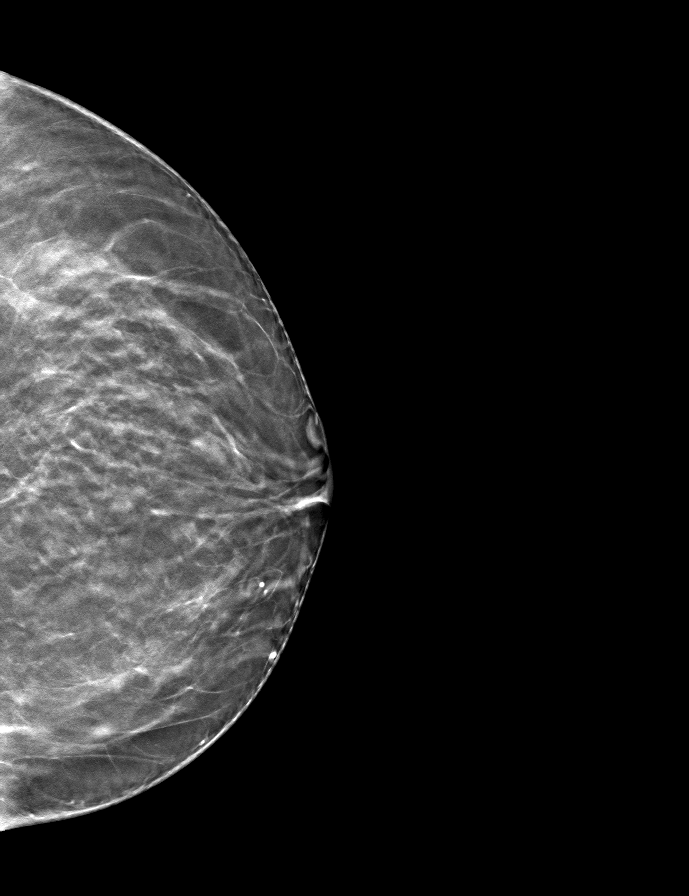

[R MLO tomo · tomo slice 37/73.0]
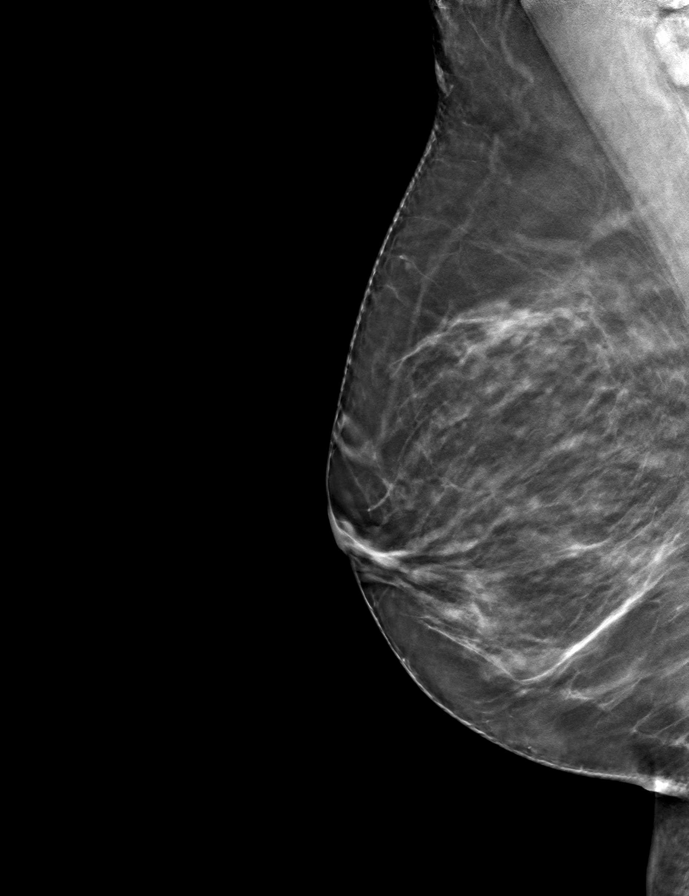

[L MLO tomo · tomo slice 40/79.0]
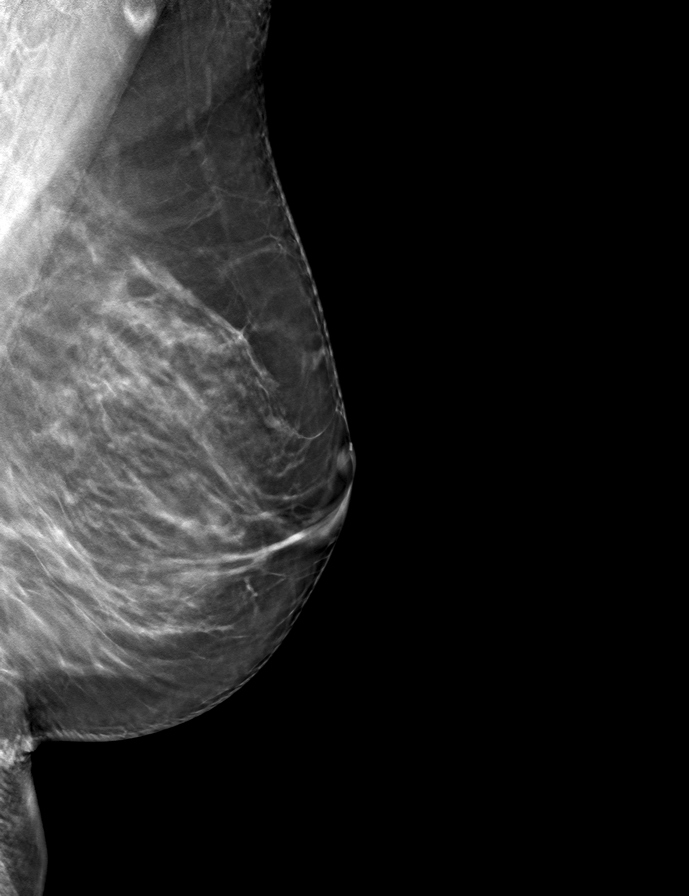

[9 of 24 positions shown; findings below may reference images not displayed]

ACR Breast Density Category b: There are scattered areas of
fibroglandular density.
FINDINGS: There are no findings suspicious for malignancy. Images were
processed with CAD.
IMPRESSION: No mammographic evidence of malignancy. A result letter of this
screening mammogram will be mailed directly to the patient.

RECOMMENDATION:
Screening mammogram in one year. (Code:[id number])

BI-RADS CATEGORY  1: Negative.

## 2020-02-02 DIAGNOSIS — E7800 Pure hypercholesterolemia, unspecified: Secondary | ICD-10-CM | POA: Diagnosis not present

## 2020-02-02 DIAGNOSIS — G720 Drug-induced myopathy: Secondary | ICD-10-CM | POA: Diagnosis not present

## 2020-02-02 DIAGNOSIS — M8580 Other specified disorders of bone density and structure, unspecified site: Secondary | ICD-10-CM | POA: Diagnosis not present

## 2020-02-02 DIAGNOSIS — E559 Vitamin D deficiency, unspecified: Secondary | ICD-10-CM | POA: Diagnosis not present

## 2020-02-02 DIAGNOSIS — I10 Essential (primary) hypertension: Secondary | ICD-10-CM | POA: Diagnosis not present

## 2020-02-02 DIAGNOSIS — Z0000 Encounter for general adult medical examination without abnormal findings: Secondary | ICD-10-CM | POA: Diagnosis not present

## 2020-03-24 DIAGNOSIS — D1801 Hemangioma of skin and subcutaneous tissue: Secondary | ICD-10-CM | POA: Diagnosis not present

## 2020-03-24 DIAGNOSIS — L821 Other seborrheic keratosis: Secondary | ICD-10-CM | POA: Diagnosis not present

## 2020-03-24 DIAGNOSIS — D2271 Melanocytic nevi of right lower limb, including hip: Secondary | ICD-10-CM | POA: Diagnosis not present

## 2020-03-24 DIAGNOSIS — D2262 Melanocytic nevi of left upper limb, including shoulder: Secondary | ICD-10-CM | POA: Diagnosis not present

## 2020-03-24 DIAGNOSIS — D225 Melanocytic nevi of trunk: Secondary | ICD-10-CM | POA: Diagnosis not present

## 2020-03-24 DIAGNOSIS — D2272 Melanocytic nevi of left lower limb, including hip: Secondary | ICD-10-CM | POA: Diagnosis not present

## 2020-03-24 DIAGNOSIS — L720 Epidermal cyst: Secondary | ICD-10-CM | POA: Diagnosis not present

## 2020-04-14 DIAGNOSIS — H2512 Age-related nuclear cataract, left eye: Secondary | ICD-10-CM | POA: Diagnosis not present

## 2020-04-14 DIAGNOSIS — H5213 Myopia, bilateral: Secondary | ICD-10-CM | POA: Diagnosis not present

## 2020-04-14 DIAGNOSIS — H43813 Vitreous degeneration, bilateral: Secondary | ICD-10-CM | POA: Diagnosis not present

## 2020-05-06 DIAGNOSIS — R69 Illness, unspecified: Secondary | ICD-10-CM | POA: Diagnosis not present

## 2020-06-18 ENCOUNTER — Other Ambulatory Visit: Payer: Self-pay | Admitting: Family Medicine

## 2020-06-18 DIAGNOSIS — Z1231 Encounter for screening mammogram for malignant neoplasm of breast: Secondary | ICD-10-CM

## 2020-06-21 ENCOUNTER — Other Ambulatory Visit: Payer: Self-pay

## 2020-06-21 ENCOUNTER — Ambulatory Visit
Admission: RE | Admit: 2020-06-21 | Discharge: 2020-06-21 | Disposition: A | Discharge status: Home / Self Care | Payer: Medicare HMO | Source: Ambulatory Visit | Attending: Family Medicine | Admitting: Family Medicine

## 2020-06-21 DIAGNOSIS — Z1231 Encounter for screening mammogram for malignant neoplasm of breast: Secondary | ICD-10-CM | POA: Diagnosis not present

## 2020-07-01 DIAGNOSIS — G5761 Lesion of plantar nerve, right lower limb: Secondary | ICD-10-CM | POA: Diagnosis not present

## 2020-07-01 DIAGNOSIS — M79671 Pain in right foot: Secondary | ICD-10-CM | POA: Diagnosis not present

## 2020-08-17 DIAGNOSIS — R69 Illness, unspecified: Secondary | ICD-10-CM | POA: Diagnosis not present

## 2021-02-04 DIAGNOSIS — E7800 Pure hypercholesterolemia, unspecified: Secondary | ICD-10-CM | POA: Diagnosis not present

## 2021-02-04 DIAGNOSIS — G720 Drug-induced myopathy: Secondary | ICD-10-CM | POA: Diagnosis not present

## 2021-02-04 DIAGNOSIS — Z0000 Encounter for general adult medical examination without abnormal findings: Secondary | ICD-10-CM | POA: Diagnosis not present

## 2021-02-04 DIAGNOSIS — I10 Essential (primary) hypertension: Secondary | ICD-10-CM | POA: Diagnosis not present

## 2021-02-04 DIAGNOSIS — E559 Vitamin D deficiency, unspecified: Secondary | ICD-10-CM | POA: Diagnosis not present

## 2021-02-04 DIAGNOSIS — M8580 Other specified disorders of bone density and structure, unspecified site: Secondary | ICD-10-CM | POA: Diagnosis not present

## 2021-02-09 ENCOUNTER — Other Ambulatory Visit: Payer: Self-pay | Admitting: Family Medicine

## 2021-02-09 DIAGNOSIS — Z1231 Encounter for screening mammogram for malignant neoplasm of breast: Secondary | ICD-10-CM

## 2021-02-09 DIAGNOSIS — M8580 Other specified disorders of bone density and structure, unspecified site: Secondary | ICD-10-CM

## 2021-04-15 DIAGNOSIS — H2512 Age-related nuclear cataract, left eye: Secondary | ICD-10-CM | POA: Diagnosis not present

## 2021-04-15 DIAGNOSIS — H5213 Myopia, bilateral: Secondary | ICD-10-CM | POA: Diagnosis not present

## 2021-04-15 DIAGNOSIS — H26491 Other secondary cataract, right eye: Secondary | ICD-10-CM | POA: Diagnosis not present

## 2021-04-20 DIAGNOSIS — I788 Other diseases of capillaries: Secondary | ICD-10-CM | POA: Diagnosis not present

## 2021-04-20 DIAGNOSIS — L821 Other seborrheic keratosis: Secondary | ICD-10-CM | POA: Diagnosis not present

## 2021-04-20 DIAGNOSIS — D2262 Melanocytic nevi of left upper limb, including shoulder: Secondary | ICD-10-CM | POA: Diagnosis not present

## 2021-04-20 DIAGNOSIS — L814 Other melanin hyperpigmentation: Secondary | ICD-10-CM | POA: Diagnosis not present

## 2021-04-20 DIAGNOSIS — D225 Melanocytic nevi of trunk: Secondary | ICD-10-CM | POA: Diagnosis not present

## 2021-04-20 DIAGNOSIS — L570 Actinic keratosis: Secondary | ICD-10-CM | POA: Diagnosis not present

## 2021-04-20 DIAGNOSIS — L918 Other hypertrophic disorders of the skin: Secondary | ICD-10-CM | POA: Diagnosis not present

## 2021-04-20 DIAGNOSIS — D2271 Melanocytic nevi of right lower limb, including hip: Secondary | ICD-10-CM | POA: Diagnosis not present

## 2021-04-20 DIAGNOSIS — D2272 Melanocytic nevi of left lower limb, including hip: Secondary | ICD-10-CM | POA: Diagnosis not present

## 2021-04-20 DIAGNOSIS — D1801 Hemangioma of skin and subcutaneous tissue: Secondary | ICD-10-CM | POA: Diagnosis not present

## 2021-05-04 DIAGNOSIS — H26491 Other secondary cataract, right eye: Secondary | ICD-10-CM | POA: Diagnosis not present

## 2021-07-20 DIAGNOSIS — R1030 Lower abdominal pain, unspecified: Secondary | ICD-10-CM | POA: Diagnosis not present

## 2021-07-20 DIAGNOSIS — Z03818 Encounter for observation for suspected exposure to other biological agents ruled out: Secondary | ICD-10-CM | POA: Diagnosis not present

## 2021-07-20 DIAGNOSIS — N3000 Acute cystitis without hematuria: Secondary | ICD-10-CM | POA: Diagnosis not present

## 2021-07-20 DIAGNOSIS — R509 Fever, unspecified: Secondary | ICD-10-CM | POA: Diagnosis not present

## 2021-07-22 DIAGNOSIS — N12 Tubulo-interstitial nephritis, not specified as acute or chronic: Secondary | ICD-10-CM | POA: Diagnosis not present

## 2021-07-28 DIAGNOSIS — U071 COVID-19: Secondary | ICD-10-CM | POA: Diagnosis not present

## 2021-07-28 DIAGNOSIS — Z6823 Body mass index (BMI) 23.0-23.9, adult: Secondary | ICD-10-CM | POA: Diagnosis not present

## 2021-08-16 DIAGNOSIS — Z0100 Encounter for examination of eyes and vision without abnormal findings: Secondary | ICD-10-CM | POA: Diagnosis not present

## 2021-09-02 ENCOUNTER — Ambulatory Visit
Admission: RE | Admit: 2021-09-02 | Discharge: 2021-09-02 | Disposition: A | Discharge status: Home / Self Care | Payer: Medicare HMO | Source: Ambulatory Visit | Attending: Family Medicine | Admitting: Family Medicine

## 2021-09-02 ENCOUNTER — Other Ambulatory Visit: Payer: Self-pay

## 2021-09-02 DIAGNOSIS — Z1231 Encounter for screening mammogram for malignant neoplasm of breast: Secondary | ICD-10-CM | POA: Diagnosis not present

## 2021-09-02 DIAGNOSIS — M85851 Other specified disorders of bone density and structure, right thigh: Secondary | ICD-10-CM | POA: Diagnosis not present

## 2021-09-02 DIAGNOSIS — Z780 Asymptomatic menopausal state: Secondary | ICD-10-CM | POA: Diagnosis not present

## 2021-09-02 DIAGNOSIS — M8580 Other specified disorders of bone density and structure, unspecified site: Secondary | ICD-10-CM

## 2021-10-27 DIAGNOSIS — I10 Essential (primary) hypertension: Secondary | ICD-10-CM | POA: Diagnosis not present

## 2021-11-10 DIAGNOSIS — J0100 Acute maxillary sinusitis, unspecified: Secondary | ICD-10-CM | POA: Diagnosis not present

## 2021-12-22 DIAGNOSIS — M67912 Unspecified disorder of synovium and tendon, left shoulder: Secondary | ICD-10-CM | POA: Diagnosis not present

## 2021-12-22 DIAGNOSIS — M79671 Pain in right foot: Secondary | ICD-10-CM | POA: Diagnosis not present

## 2022-01-17 DIAGNOSIS — M25512 Pain in left shoulder: Secondary | ICD-10-CM | POA: Diagnosis not present

## 2022-01-17 DIAGNOSIS — M67912 Unspecified disorder of synovium and tendon, left shoulder: Secondary | ICD-10-CM | POA: Diagnosis not present

## 2022-01-27 DIAGNOSIS — J40 Bronchitis, not specified as acute or chronic: Secondary | ICD-10-CM | POA: Diagnosis not present

## 2022-01-27 DIAGNOSIS — J019 Acute sinusitis, unspecified: Secondary | ICD-10-CM | POA: Diagnosis not present

## 2022-02-08 DIAGNOSIS — Z0000 Encounter for general adult medical examination without abnormal findings: Secondary | ICD-10-CM | POA: Diagnosis not present

## 2022-02-22 DIAGNOSIS — E559 Vitamin D deficiency, unspecified: Secondary | ICD-10-CM | POA: Diagnosis not present

## 2022-02-22 DIAGNOSIS — Z0000 Encounter for general adult medical examination without abnormal findings: Secondary | ICD-10-CM | POA: Diagnosis not present

## 2022-02-22 DIAGNOSIS — M8588 Other specified disorders of bone density and structure, other site: Secondary | ICD-10-CM | POA: Diagnosis not present

## 2022-02-22 DIAGNOSIS — I10 Essential (primary) hypertension: Secondary | ICD-10-CM | POA: Diagnosis not present

## 2022-02-22 DIAGNOSIS — E7800 Pure hypercholesterolemia, unspecified: Secondary | ICD-10-CM | POA: Diagnosis not present

## 2022-02-22 DIAGNOSIS — G720 Drug-induced myopathy: Secondary | ICD-10-CM | POA: Diagnosis not present

## 2022-04-06 DIAGNOSIS — J0140 Acute pansinusitis, unspecified: Secondary | ICD-10-CM | POA: Diagnosis not present

## 2022-05-01 DIAGNOSIS — I788 Other diseases of capillaries: Secondary | ICD-10-CM | POA: Diagnosis not present

## 2022-05-01 DIAGNOSIS — L820 Inflamed seborrheic keratosis: Secondary | ICD-10-CM | POA: Diagnosis not present

## 2022-05-01 DIAGNOSIS — L814 Other melanin hyperpigmentation: Secondary | ICD-10-CM | POA: Diagnosis not present

## 2022-05-01 DIAGNOSIS — D2272 Melanocytic nevi of left lower limb, including hip: Secondary | ICD-10-CM | POA: Diagnosis not present

## 2022-05-01 DIAGNOSIS — D1801 Hemangioma of skin and subcutaneous tissue: Secondary | ICD-10-CM | POA: Diagnosis not present

## 2022-05-01 DIAGNOSIS — D2271 Melanocytic nevi of right lower limb, including hip: Secondary | ICD-10-CM | POA: Diagnosis not present

## 2022-05-01 DIAGNOSIS — L821 Other seborrheic keratosis: Secondary | ICD-10-CM | POA: Diagnosis not present

## 2022-05-22 DIAGNOSIS — H5213 Myopia, bilateral: Secondary | ICD-10-CM | POA: Diagnosis not present

## 2022-05-22 DIAGNOSIS — H2512 Age-related nuclear cataract, left eye: Secondary | ICD-10-CM | POA: Diagnosis not present

## 2022-07-20 ENCOUNTER — Other Ambulatory Visit: Payer: Self-pay | Admitting: Family Medicine

## 2022-07-20 DIAGNOSIS — Z1231 Encounter for screening mammogram for malignant neoplasm of breast: Secondary | ICD-10-CM

## 2022-09-05 DIAGNOSIS — Z0100 Encounter for examination of eyes and vision without abnormal findings: Secondary | ICD-10-CM | POA: Diagnosis not present

## 2022-10-05 ENCOUNTER — Ambulatory Visit
Admission: RE | Admit: 2022-10-05 | Discharge: 2022-10-05 | Disposition: A | Discharge status: Home / Self Care | Payer: Medicare HMO | Source: Ambulatory Visit | Attending: Family Medicine | Admitting: Family Medicine

## 2022-10-05 DIAGNOSIS — Z1231 Encounter for screening mammogram for malignant neoplasm of breast: Secondary | ICD-10-CM

## 2022-10-23 DIAGNOSIS — H25812 Combined forms of age-related cataract, left eye: Secondary | ICD-10-CM | POA: Diagnosis not present

## 2022-11-01 DIAGNOSIS — U071 COVID-19: Secondary | ICD-10-CM | POA: Diagnosis not present

## 2022-11-16 DIAGNOSIS — H269 Unspecified cataract: Secondary | ICD-10-CM | POA: Diagnosis not present

## 2022-11-16 DIAGNOSIS — H2512 Age-related nuclear cataract, left eye: Secondary | ICD-10-CM | POA: Diagnosis not present

## 2023-02-12 DIAGNOSIS — Z0000 Encounter for general adult medical examination without abnormal findings: Secondary | ICD-10-CM | POA: Diagnosis not present

## 2023-02-12 DIAGNOSIS — Z6826 Body mass index (BMI) 26.0-26.9, adult: Secondary | ICD-10-CM | POA: Diagnosis not present

## 2023-02-23 DIAGNOSIS — I10 Essential (primary) hypertension: Secondary | ICD-10-CM | POA: Diagnosis not present

## 2023-02-23 DIAGNOSIS — E7800 Pure hypercholesterolemia, unspecified: Secondary | ICD-10-CM | POA: Diagnosis not present

## 2023-02-23 DIAGNOSIS — E559 Vitamin D deficiency, unspecified: Secondary | ICD-10-CM | POA: Diagnosis not present

## 2023-02-26 DIAGNOSIS — Z6825 Body mass index (BMI) 25.0-25.9, adult: Secondary | ICD-10-CM | POA: Diagnosis not present

## 2023-02-26 DIAGNOSIS — E7800 Pure hypercholesterolemia, unspecified: Secondary | ICD-10-CM | POA: Diagnosis not present

## 2023-02-26 DIAGNOSIS — G720 Drug-induced myopathy: Secondary | ICD-10-CM | POA: Diagnosis not present

## 2023-02-26 DIAGNOSIS — E559 Vitamin D deficiency, unspecified: Secondary | ICD-10-CM | POA: Diagnosis not present

## 2023-02-26 DIAGNOSIS — N951 Menopausal and female climacteric states: Secondary | ICD-10-CM | POA: Diagnosis not present

## 2023-02-26 DIAGNOSIS — Z0000 Encounter for general adult medical examination without abnormal findings: Secondary | ICD-10-CM | POA: Diagnosis not present

## 2023-02-26 DIAGNOSIS — I10 Essential (primary) hypertension: Secondary | ICD-10-CM | POA: Diagnosis not present

## 2023-02-26 DIAGNOSIS — M8588 Other specified disorders of bone density and structure, other site: Secondary | ICD-10-CM | POA: Diagnosis not present

## 2023-07-23 DIAGNOSIS — L814 Other melanin hyperpigmentation: Secondary | ICD-10-CM | POA: Diagnosis not present

## 2023-07-23 DIAGNOSIS — L821 Other seborrheic keratosis: Secondary | ICD-10-CM | POA: Diagnosis not present

## 2023-07-23 DIAGNOSIS — D2272 Melanocytic nevi of left lower limb, including hip: Secondary | ICD-10-CM | POA: Diagnosis not present

## 2023-07-23 DIAGNOSIS — D225 Melanocytic nevi of trunk: Secondary | ICD-10-CM | POA: Diagnosis not present

## 2023-07-23 DIAGNOSIS — D1801 Hemangioma of skin and subcutaneous tissue: Secondary | ICD-10-CM | POA: Diagnosis not present

## 2023-07-23 DIAGNOSIS — D2261 Melanocytic nevi of right upper limb, including shoulder: Secondary | ICD-10-CM | POA: Diagnosis not present

## 2023-08-22 ENCOUNTER — Other Ambulatory Visit: Payer: Self-pay | Admitting: Family Medicine

## 2023-08-22 DIAGNOSIS — Z1231 Encounter for screening mammogram for malignant neoplasm of breast: Secondary | ICD-10-CM

## 2023-10-08 ENCOUNTER — Ambulatory Visit
Admission: RE | Admit: 2023-10-08 | Discharge: 2023-10-08 | Disposition: A | Discharge status: Home / Self Care | Payer: Medicare HMO | Source: Ambulatory Visit | Attending: Family Medicine | Admitting: Family Medicine

## 2023-10-08 DIAGNOSIS — Z1231 Encounter for screening mammogram for malignant neoplasm of breast: Secondary | ICD-10-CM

## 2023-10-26 DIAGNOSIS — G43909 Migraine, unspecified, not intractable, without status migrainosus: Secondary | ICD-10-CM | POA: Diagnosis not present

## 2023-12-18 DIAGNOSIS — R52 Pain, unspecified: Secondary | ICD-10-CM | POA: Diagnosis not present

## 2023-12-18 DIAGNOSIS — Z03818 Encounter for observation for suspected exposure to other biological agents ruled out: Secondary | ICD-10-CM | POA: Diagnosis not present

## 2023-12-18 DIAGNOSIS — J101 Influenza due to other identified influenza virus with other respiratory manifestations: Secondary | ICD-10-CM | POA: Diagnosis not present

## 2023-12-18 DIAGNOSIS — R053 Chronic cough: Secondary | ICD-10-CM | POA: Diagnosis not present

## 2023-12-18 DIAGNOSIS — R059 Cough, unspecified: Secondary | ICD-10-CM | POA: Diagnosis not present

## 2023-12-18 DIAGNOSIS — Z6825 Body mass index (BMI) 25.0-25.9, adult: Secondary | ICD-10-CM | POA: Diagnosis not present

## 2024-01-02 DIAGNOSIS — Z961 Presence of intraocular lens: Secondary | ICD-10-CM | POA: Diagnosis not present

## 2024-01-02 DIAGNOSIS — H04123 Dry eye syndrome of bilateral lacrimal glands: Secondary | ICD-10-CM | POA: Diagnosis not present

## 2024-01-02 DIAGNOSIS — H5211 Myopia, right eye: Secondary | ICD-10-CM | POA: Diagnosis not present

## 2024-02-02 DIAGNOSIS — M79671 Pain in right foot: Secondary | ICD-10-CM | POA: Diagnosis not present

## 2024-02-11 DIAGNOSIS — M2041 Other hammer toe(s) (acquired), right foot: Secondary | ICD-10-CM | POA: Diagnosis not present

## 2024-02-11 DIAGNOSIS — M2021 Hallux rigidus, right foot: Secondary | ICD-10-CM | POA: Diagnosis not present

## 2024-02-13 DIAGNOSIS — E559 Vitamin D deficiency, unspecified: Secondary | ICD-10-CM | POA: Diagnosis not present

## 2024-02-13 DIAGNOSIS — Z6825 Body mass index (BMI) 25.0-25.9, adult: Secondary | ICD-10-CM | POA: Diagnosis not present

## 2024-02-13 DIAGNOSIS — Z23 Encounter for immunization: Secondary | ICD-10-CM | POA: Diagnosis not present

## 2024-02-13 DIAGNOSIS — I10 Essential (primary) hypertension: Secondary | ICD-10-CM | POA: Diagnosis not present

## 2024-02-13 DIAGNOSIS — Z1331 Encounter for screening for depression: Secondary | ICD-10-CM | POA: Diagnosis not present

## 2024-02-13 DIAGNOSIS — Z0000 Encounter for general adult medical examination without abnormal findings: Secondary | ICD-10-CM | POA: Diagnosis not present

## 2024-02-13 DIAGNOSIS — E7800 Pure hypercholesterolemia, unspecified: Secondary | ICD-10-CM | POA: Diagnosis not present

## 2024-02-14 ENCOUNTER — Other Ambulatory Visit: Payer: Self-pay | Admitting: Family Medicine

## 2024-02-14 DIAGNOSIS — M8580 Other specified disorders of bone density and structure, unspecified site: Secondary | ICD-10-CM

## 2024-02-28 ENCOUNTER — Other Ambulatory Visit: Payer: Self-pay | Admitting: Family Medicine

## 2024-02-28 DIAGNOSIS — E559 Vitamin D deficiency, unspecified: Secondary | ICD-10-CM | POA: Diagnosis not present

## 2024-02-28 DIAGNOSIS — Z1231 Encounter for screening mammogram for malignant neoplasm of breast: Secondary | ICD-10-CM

## 2024-02-28 DIAGNOSIS — I10 Essential (primary) hypertension: Secondary | ICD-10-CM | POA: Diagnosis not present

## 2024-02-28 DIAGNOSIS — M8580 Other specified disorders of bone density and structure, unspecified site: Secondary | ICD-10-CM | POA: Diagnosis not present

## 2024-02-28 DIAGNOSIS — Z0000 Encounter for general adult medical examination without abnormal findings: Secondary | ICD-10-CM | POA: Diagnosis not present

## 2024-02-28 DIAGNOSIS — Z6825 Body mass index (BMI) 25.0-25.9, adult: Secondary | ICD-10-CM | POA: Diagnosis not present

## 2024-02-28 DIAGNOSIS — E7800 Pure hypercholesterolemia, unspecified: Secondary | ICD-10-CM | POA: Diagnosis not present

## 2024-02-28 DIAGNOSIS — N951 Menopausal and female climacteric states: Secondary | ICD-10-CM | POA: Diagnosis not present

## 2024-03-26 DIAGNOSIS — M2021 Hallux rigidus, right foot: Secondary | ICD-10-CM | POA: Diagnosis not present

## 2024-07-01 DIAGNOSIS — M542 Cervicalgia: Secondary | ICD-10-CM | POA: Diagnosis not present

## 2024-07-03 DIAGNOSIS — M4722 Other spondylosis with radiculopathy, cervical region: Secondary | ICD-10-CM | POA: Diagnosis not present

## 2024-07-14 DIAGNOSIS — M4722 Other spondylosis with radiculopathy, cervical region: Secondary | ICD-10-CM | POA: Diagnosis not present

## 2024-07-16 DIAGNOSIS — M4722 Other spondylosis with radiculopathy, cervical region: Secondary | ICD-10-CM | POA: Diagnosis not present

## 2024-07-22 DIAGNOSIS — M4722 Other spondylosis with radiculopathy, cervical region: Secondary | ICD-10-CM | POA: Diagnosis not present

## 2024-07-23 DIAGNOSIS — M4722 Other spondylosis with radiculopathy, cervical region: Secondary | ICD-10-CM | POA: Diagnosis not present

## 2024-07-28 DIAGNOSIS — M4722 Other spondylosis with radiculopathy, cervical region: Secondary | ICD-10-CM | POA: Diagnosis not present

## 2024-07-30 DIAGNOSIS — M4722 Other spondylosis with radiculopathy, cervical region: Secondary | ICD-10-CM | POA: Diagnosis not present

## 2024-08-04 DIAGNOSIS — M4722 Other spondylosis with radiculopathy, cervical region: Secondary | ICD-10-CM | POA: Diagnosis not present

## 2024-08-06 DIAGNOSIS — M4722 Other spondylosis with radiculopathy, cervical region: Secondary | ICD-10-CM | POA: Diagnosis not present

## 2024-08-11 DIAGNOSIS — M542 Cervicalgia: Secondary | ICD-10-CM | POA: Diagnosis not present

## 2024-08-13 DIAGNOSIS — M4722 Other spondylosis with radiculopathy, cervical region: Secondary | ICD-10-CM | POA: Diagnosis not present

## 2024-08-18 DIAGNOSIS — M4722 Other spondylosis with radiculopathy, cervical region: Secondary | ICD-10-CM | POA: Diagnosis not present

## 2024-08-20 DIAGNOSIS — M4722 Other spondylosis with radiculopathy, cervical region: Secondary | ICD-10-CM | POA: Diagnosis not present

## 2024-09-26 DIAGNOSIS — M2021 Hallux rigidus, right foot: Secondary | ICD-10-CM | POA: Diagnosis not present

## 2024-09-26 DIAGNOSIS — M2041 Other hammer toe(s) (acquired), right foot: Secondary | ICD-10-CM | POA: Diagnosis not present

## 2024-10-20 DIAGNOSIS — L918 Other hypertrophic disorders of the skin: Secondary | ICD-10-CM | POA: Diagnosis not present

## 2024-10-20 DIAGNOSIS — D225 Melanocytic nevi of trunk: Secondary | ICD-10-CM | POA: Diagnosis not present

## 2024-10-20 DIAGNOSIS — I788 Other diseases of capillaries: Secondary | ICD-10-CM | POA: Diagnosis not present

## 2024-10-20 DIAGNOSIS — D1801 Hemangioma of skin and subcutaneous tissue: Secondary | ICD-10-CM | POA: Diagnosis not present

## 2024-10-20 DIAGNOSIS — L570 Actinic keratosis: Secondary | ICD-10-CM | POA: Diagnosis not present

## 2024-10-20 DIAGNOSIS — L821 Other seborrheic keratosis: Secondary | ICD-10-CM | POA: Diagnosis not present

## 2024-11-13 ENCOUNTER — Other Ambulatory Visit (HOSPITAL_BASED_OUTPATIENT_CLINIC_OR_DEPARTMENT_OTHER)

## 2024-11-13 ENCOUNTER — Ambulatory Visit

## 2024-11-13 ENCOUNTER — Other Ambulatory Visit
